# Patient Record
Sex: Female | Born: 1985 | Race: Black or African American | Hispanic: No | Marital: Single | Smoking: Current some day smoker
Health system: Southern US, Community
[De-identification: ages and names within clinical notes are randomized; demographics above are authoritative.]

## PROBLEM LIST (undated history)

## (undated) DIAGNOSIS — Z789 Other specified health status: Secondary | ICD-10-CM

## (undated) DIAGNOSIS — T7840XA Allergy, unspecified, initial encounter: Secondary | ICD-10-CM

## (undated) DIAGNOSIS — I10 Essential (primary) hypertension: Secondary | ICD-10-CM

## (undated) HISTORY — DX: Essential (primary) hypertension: I10

## (undated) HISTORY — DX: Other specified health status: Z78.9

## (undated) HISTORY — DX: Allergy, unspecified, initial encounter: T78.40XA

---

## 2006-05-15 ENCOUNTER — Ambulatory Visit (HOSPITAL_COMMUNITY): Admission: RE | Admit: 2006-05-15 | Discharge: 2006-05-15 | Payer: Self-pay | Admitting: Family Medicine

## 2006-05-30 ENCOUNTER — Ambulatory Visit (HOSPITAL_COMMUNITY): Admission: RE | Admit: 2006-05-30 | Discharge: 2006-05-30 | Payer: Self-pay | Admitting: Family Medicine

## 2006-10-24 ENCOUNTER — Inpatient Hospital Stay (HOSPITAL_COMMUNITY): Admission: AD | Admit: 2006-10-24 | Discharge: 2006-10-27 | Payer: Self-pay | Admitting: Obstetrics

## 2006-10-24 ENCOUNTER — Encounter (INDEPENDENT_AMBULATORY_CARE_PROVIDER_SITE_OTHER): Payer: Self-pay | Admitting: Specialist

## 2010-04-19 ENCOUNTER — Emergency Department (HOSPITAL_BASED_OUTPATIENT_CLINIC_OR_DEPARTMENT_OTHER): Admission: EM | Admit: 2010-04-19 | Discharge: 2010-04-19 | Payer: Self-pay | Admitting: Emergency Medicine

## 2011-01-09 LAB — URINE MICROSCOPIC-ADD ON

## 2011-01-09 LAB — URINALYSIS, ROUTINE W REFLEX MICROSCOPIC
Glucose, UA: NEGATIVE mg/dL
Leukocytes, UA: NEGATIVE

## 2011-01-09 LAB — PREGNANCY, URINE: Preg Test, Ur: NEGATIVE

## 2011-01-09 LAB — GC/CHLAMYDIA PROBE AMP, GENITAL
Chlamydia, DNA Probe: NEGATIVE
GC Probe Amp, Genital: NEGATIVE

## 2011-01-09 LAB — WET PREP, GENITAL

## 2011-03-11 NOTE — H&P (Signed)
NAME:  Elaine Martin, Elaine Martin                ACCOUNT NO.:  [id number]   MEDICAL RECORD NO.:  [id number]          PATIENT TYPE:  INP   LOCATION:  9124                          FACILITY:  [facility]   PHYSICIAN:  Kathreen Cosier, M.D.DATE OF BIRTH:  06/12/1986   DATE OF ADMISSION:  10/24/2006  DATE OF DISCHARGE:                              HISTORY & PHYSICAL   The patient is a 25 year old gravida 2, para 0-0-1-0, [facility] October 27, 2006.  She was admitted in labor, 4 cm, vertex, -3.  At 6:30 a.m. she  was low, anterior with a vertex at -2 to -3 station, intact.  Membranes  were ruptured artificially and fluid was meconium stained.  She had a  positive GBS and received penicillin.  It was noted the patient had  variable decelerations with each contraction, and at one stage had a 7  minute deceleration of less than 90.  She was started on oxygen and  position change instituted.  At 8:45 a.m. started pushing, and there was  no descent of the vertex with 2 contractions, but she had deep variables  with each contraction.  So, it was decided she would be delivered by C-  section for nonreassuring fetal heart tracing.   PHYSICAL EXAMINATION:  GENERAL:  Revealed a well-developed female in  labor.  HEENT:  Negative.  LUNGS:  Clear.  HEART:  Regular rhythm.  No murmurs, rubs or gallops.  BREASTS:  No masses.  ABDOMEN:  Term sized uterus.  Estimated fetal weight was 6 pounds 14  ounces.  EXTREMITIES:  Negative.           ______________________________  Kathreen Cosier, M.D.     BAM/MEDQ  D:  10/24/2006  T:  10/24/2006  Job:  [id number]

## 2011-03-11 NOTE — Op Note (Signed)
Elaine Martin, Elaine Martin                ACCOUNT NO.:  [id number]   MEDICAL RECORD NO.:  [id number]          PATIENT TYPE:  INP   LOCATION:  9124                          FACILITY[facility]  [facility]   PHYSICIAN:  Kathreen Cosier, M.D.DATE OF BIRTH:  10-Dec-1985   DATE OF PROCEDURE:  DATE OF DISCHARGE:                               OPERATIVE REPORT   DATE OF OPERATION:  10/24/2006   PREOPERATIVE DIAGNOSIS:  Nonreassuring fetal heart rate tracing.   PROCEDURE:  Primary low transverse cesarean section.   SURGEON:  Kathreen Cosier, M.D.   ANESTHESIA:  Epidural.   PROCEDURE:  Patient placed on the operating table in the supine  position.  Abdomen prepped and draped.  Bladder emptied with a Foley  catheter.  A transverse suprapubic incision made, carried down to the  rectus fascia.  Fascia __________  muscles retracted laterally.  Peritoneum incised longitudinally.  A transverse incision made on the  visceral peritoneum above the bladder, bladder mobilized.  Transverse  lower uterine incision made __________  delivered from the OP position,  a female, Apgars 4 and 9, weighing 6 pounds 2 ounces.  The team was in  attendance.  The placenta was posterior, removed manually and sent to  Pathology.  Cord pH was 7.19.  uterine cavity cleaned with dry laps.  Uterine incision closed in one layer with continuous suture of #1  chromic.  Hemostasis satisfactory.  Bladder flap reattached with 2-0  chromic.  Uterus well contracted.  Tubes and ovaries normal.  Abdomen  closed in layers.  Peritoneum with continuous suture of 0 chromic,  fascia with continuous suture of 0 Dexon.  Skin closed with subcuticular  stitch of 4-0 Monocryl.  Blood loss 500 mL.  The patient tolerated the  procedure well, taken to recovery room in good condition.           ______________________________  Kathreen Cosier, M.D.     BAM/MEDQ  D:  10/24/2006  T:  10/24/2006  Job:  [id number]

## 2011-03-11 NOTE — Discharge Summary (Signed)
NAME:  LAURENA, VALKO                ACCOUNT NO.:  [id number]   MEDICAL RECORD NO.:  [id number]          PATIENT TYPE:  INP   LOCATION:  9124                          [facility]ILITY[facility]  [facility]   PHYSICIAN:  Kathreen Cosier, M.D.DATE OF BIRTH:  1986/09/03   DATE OF ADMISSION:  10/24/2006  DATE OF DISCHARGE:  10/27/2006                               DISCHARGE SUMMARY   HOSPITAL COURSE:  The patient is a 25 year old gravida 2, para 0-0-1-0  with [facility] October 27, 2006 admitted in labor and shows anterior  lip with  a vertex at the minus 3 station.  Membranes were ruptured artificially.  She had meconium stained fluid.  The patient was noted to have  spontaneous variable decelerations which lasted up to 7 minutes.  When  she became fully dilated and started pushing there was no descent of the  vertex, however, she had deep variable decelerations and she was taken  for a Cesarean section for non-reassuring fetal heart rate tracing.  She  had a female with Apgar's of 4 and 9, weighing 6 pounds, 2 ounces from the  OP position.  Cord pH was 7.19.  The fetal heart rate was noted to be 60  when she was taken the operating room.  Postoperatively she did well.  Her hemoglobin was 9.3.  She was discharged on the third postoperative  day, ambulatory and on a regular diet on Tylox for pain, ferrous sulfate  for her anemia and is to see me in six weeks.   DISCHARGE DIAGNOSIS:  Status post primary low transverse Cesarean  section at term for non-reassuring fetal heart rate tracing.           ______________________________  Kathreen Cosier, M.D.     BAM/MEDQ  D:  11/08/2006  T:  11/08/2006  Job:  [id number]

## 2014-01-06 ENCOUNTER — Emergency Department (HOSPITAL_COMMUNITY): Payer: Managed Care, Other (non HMO)

## 2014-01-06 ENCOUNTER — Emergency Department (HOSPITAL_COMMUNITY)
Admission: EM | Admit: 2014-01-06 | Discharge: 2014-01-06 | Disposition: A | Discharge status: Home / Self Care | Payer: Managed Care, Other (non HMO) | Attending: Emergency Medicine | Admitting: Emergency Medicine

## 2014-01-06 DIAGNOSIS — M25469 Effusion, unspecified knee: Secondary | ICD-10-CM | POA: Insufficient documentation

## 2014-01-06 DIAGNOSIS — M25561 Pain in right knee: Secondary | ICD-10-CM

## 2014-01-06 DIAGNOSIS — M25569 Pain in unspecified knee: Secondary | ICD-10-CM | POA: Insufficient documentation

## 2014-01-06 DIAGNOSIS — Z87828 Personal history of other (healed) physical injury and trauma: Secondary | ICD-10-CM | POA: Insufficient documentation

## 2014-01-06 MED ORDER — IBUPROFEN 800 MG PO TABS
800.0000 mg | ORAL_TABLET | Freq: Once | ORAL | Status: AC
  Administered 2014-01-06: 800 mg via ORAL
  Filled 2014-01-06: qty 1

## 2014-01-06 MED ORDER — IBUPROFEN 800 MG PO TABS: 800.0000 mg | ORAL_TABLET | Freq: Three times a day (TID) | ORAL | Status: DC

## 2014-01-06 MED ORDER — HYDROCODONE-ACETAMINOPHEN 5-325 MG PO TABS: 1.0000 | ORAL_TABLET | ORAL | Status: DC | PRN

## 2014-01-06 NOTE — Discharge Instructions (Signed)
Call for a follow up appointment with Dr. Luiz BlareGraves or an Orthopedic Surgeon.  Return if Symptoms worsen, you develop a fever or your knee starts turning red.   Take medication as prescribed.  Ice your knee 3-4 times a day.

## 2014-01-06 NOTE — ED Notes (Signed)
She c/o right knee pain ever since stepping off curb yesterday.  She is in no distress.

## 2014-01-06 NOTE — ED Provider Notes (Signed)
Medical screening examination/treatment/procedure(s) were performed by non-physician practitioner and as supervising physician I was immediately available for consultation/collaboration.   EKG Interpretation None       Shon Batonourtney F Horton, MD 01/06/14 [phone]

## 2014-01-06 NOTE — ED Provider Notes (Signed)
CSN: [id number]     Arrival date & time 01/06/14  0745 History   First MD Initiated Contact with Patient 01/06/14 0750     Chief Complaint  Patient presents with  . Knee Pain     (Consider location/radiation/quality/duration/timing/severity/associated sxs/prior Treatment) HPI Comments: Elaine Martin is a 28 y.o. Female, presents to the Emergency Department with a chief complaint of Right knee pain and swelling since yesterday.  The patient reports stepping down off a curb yesterday and immediately having discomfort. She describes the discomfort as "not feeling right" but denies pain.  She reports associated decrease in ROM and swelling.  She denies falling or other injury.  The patient states she had a right knee dislocation 11 years ago.   Patient is a 28 y.o. female presenting with knee pain. The history is provided by the patient. No language interpreter was used.  Knee Pain Associated symptoms: no back pain, no fever and no neck pain     No past medical history on file. No past surgical history on file. No family history on file. History  Substance Use Topics  . Smoking status: Not on file  . Smokeless tobacco: Not on file  . Alcohol Use: Not on file   OB History   No data available     Review of Systems  Constitutional: Negative for fever and chills.  Musculoskeletal: Positive for arthralgias, gait problem and joint swelling. Negative for back pain, neck pain and neck stiffness.  Skin: Negative for color change, rash and wound.  Neurological: Negative for numbness.      Allergies  Review of patient's allergies indicates no known allergies.  Home Medications  No current outpatient prescriptions on file. LMP 12/26/2013 Physical Exam  Nursing note and vitals reviewed. Constitutional: She is oriented to person, place, and time. She appears well-developed and well-nourished. No distress.  HENT:  Head: Normocephalic and atraumatic.  Neck: Neck supple.   Pulmonary/Chest: Effort normal. No respiratory distress.  Musculoskeletal: She exhibits edema.       Right knee: She exhibits decreased range of motion and swelling. She exhibits no ecchymosis, no deformity, no laceration, no erythema, no LCL laxity, normal patellar mobility, no bony tenderness and no MCL laxity. No medial joint line, no lateral joint line and no patellar tendon tenderness noted.  Right Knee: Mild swelling, decrease in active flexion and extension. No crepitus. No erythema or increased temperature to touch.  Neurological: She is alert and oriented to person, place, and time. No sensory deficit. Gait abnormal.  Skin: Skin is warm.  Psychiatric: She has a normal mood and affect. Her behavior is normal.    ED Course  Procedures (including critical care time) Labs Review Labs Reviewed - No data to display Imaging Review Dg Knee Complete 4 Views Right  01/06/2014   CLINICAL DATA:  Right knee injury, now with pain, history of previous dislocation  EXAM: RIGHT KNEE - COMPLETE 4+ VIEW  COMPARISON:  None.  FINDINGS: The bones of the knee are adequately mineralized. There is no evidence of an acute fracture or dislocation. There is no lytic or blastic lesion or periosteal reaction. There may be a joint effusion.  IMPRESSION: There is no acute bony abnormality of the right knee but a joint effusion may be present. Follow-up MRI would be of value if the patient's symptoms persist despite therapy.   Electronically Signed   By: David  [place]   On: 01/06/2014 08:32     EKG Interpretation None  MDM   Final diagnoses:  Right knee pain   Pt with decrease ROM and mild swelling to right knee, history of knee dislocation.  XR without fracture, questionable effusion. Discussed imaging results, and treatment plan (Crutches, ACEwrap, follow up with Ortho, RICE encouraged) with the patient. Return precautions given. Reports understanding and no other concerns at this time.  Patient is  stable for discharge at this time.  Meds given in ED:  Medications  ibuprofen (ADVIL,MOTRIN) tablet 800 mg (800 mg Oral Given 01/06/14 0830)    New Prescriptions   No medications on file        Clabe Seal, PA-C 01/06/14 1610  Clabe Seal, PA-C 01/06/14 [phone]

## 2014-04-21 ENCOUNTER — Ambulatory Visit: Payer: Self-pay | Admitting: Obstetrics

## 2014-05-08 ENCOUNTER — Ambulatory Visit (INDEPENDENT_AMBULATORY_CARE_PROVIDER_SITE_OTHER): Payer: Managed Care, Other (non HMO) | Admitting: Obstetrics

## 2014-05-08 ENCOUNTER — Encounter: Payer: Self-pay | Admitting: Obstetrics

## 2014-05-08 VITALS — BP 160/94 | HR 81 | Temp 98.1°F | Ht 64.0 in | Wt 219.0 lb

## 2014-05-08 DIAGNOSIS — B9689 Other specified bacterial agents as the cause of diseases classified elsewhere: Secondary | ICD-10-CM

## 2014-05-08 DIAGNOSIS — N760 Acute vaginitis: Secondary | ICD-10-CM

## 2014-05-08 DIAGNOSIS — Z01419 Encounter for gynecological examination (general) (routine) without abnormal findings: Secondary | ICD-10-CM

## 2014-05-08 MED ORDER — TINIDAZOLE 500 MG PO TABS: 1000.0000 mg | ORAL_TABLET | Freq: Every day | ORAL | Status: DC

## 2014-05-09 LAB — PAP IG W/ RFLX HPV ASCU

## 2014-05-09 LAB — WET PREP BY MOLECULAR PROBE
CANDIDA SPECIES: NEGATIVE
GARDNERELLA VAGINALIS: POSITIVE — AB
Trichomonas vaginosis: NEGATIVE

## 2014-05-09 NOTE — Progress Notes (Signed)
Subjective:     Elaine Martin is a 28 y.o. female here for a routine exam.  Current complaints: None.    Personal health questionnaire:  Is patient Ashkenazi Jewish, have a family history of breast and/or ovarian cancer: no Is there a family history of uterine cancer diagnosed at age < 39, gastrointestinal cancer, urinary tract cancer, family member who is a Personnel officer syndrome-associated carrier: no Is the patient overweight and hypertensive, family history of diabetes, personal history of gestational diabetes or PCOS: no Is patient over 65, have PCOS,  family history of premature CHD under age 1, diabetes, smoke, have hypertension or peripheral artery disease:  no  The HPI was reviewed and explored in further detail by the provider. Gynecologic History Patient's last menstrual period was 04/17/2014. Contraception: none Last Pap: 2014. Results were: normal Last mammogram: n/a. Results were: n/a  Obstetric History OB History  Gravida Para Term Preterm AB SAB TAB Ectopic Multiple Living  2 1 1  1     1     # Outcome Date GA Lbr Len/2nd Weight Sex Delivery Anes PTL Lv  2 TRM 10/24/06 [redacted]w[redacted]d  6 lb 2 oz (2.778 kg) M LTCS Spinal N Y  1 ABT 2004              History reviewed. No pertinent past medical history.  History reviewed. No pertinent past surgical history.  Current outpatient prescriptions:tinidazole (TINDAMAX) 500 MG tablet, Take 2 tablets (1,000 mg total) by mouth daily with breakfast., Disp: 10 tablet, Rfl: 2 No Known Allergies  History  Substance Use Topics  . Smoking status: Current Every Day Smoker    Types: Cigarettes  . Smokeless tobacco: Never Used  . Alcohol Use: No    Family History  Problem Relation Age of Onset  . Hypertension Mother   . Diabetes Mother       Review of Systems  Constitutional: negative for fatigue and weight loss Respiratory: negative for cough and wheezing Cardiovascular: negative for chest pain, fatigue and palpitations Gastrointestinal:  negative for abdominal pain and change in bowel habits Musculoskeletal:negative for myalgias Neurological: negative for gait problems and tremors Behavioral/Psych: negative for abusive relationship, depression Endocrine: negative for temperature intolerance   Genitourinary:negative for abnormal menstrual periods, genital lesions, hot flashes, sexual problems and vaginal discharge Integument/breast: negative for breast lump, breast tenderness, nipple discharge and skin lesion(s)    Objective:       BP 160/94  Pulse 81  Temp(Src) 98.1 F (36.7 C)  Ht 5\' 4"  (1.626 m)  Wt 219 lb (99.338 kg)  BMI 37.57 kg/m2  LMP 04/17/2014 General:   alert  Skin:   no rash or abnormalities  Lungs:   clear to auscultation bilaterally  Heart:   regular rate and rhythm, S1, S2 normal, no murmur, click, rub or gallop  Breasts:   normal without suspicious masses, skin or nipple changes or axillary nodes  Abdomen:  normal findings: no organomegaly, soft, non-tender and no hernia  Pelvis:  External genitalia: normal general appearance Urinary system: urethral meatus normal and bladder without fullness, nontender Vaginal: normal without tenderness, induration or masses Cervix: normal appearance Adnexa: normal bimanual exam Uterus: anteverted and non-tender, normal size   Lab Review Urine pregnancy test Labs reviewed yes Radiologic studies reviewed no   Assessment:    Healthy female exam.    Plan:    Education reviewed: low fat, low cholesterol diet, safe sex/STD prevention, self breast exams, weight bearing exercise and management and prevention of  BV. Contraception: none. Follow up in: 1 year. Tinidazole Rx for BV.   Meds ordered this encounter  Medications  . tinidazole (TINDAMAX) 500 MG tablet    Sig: Take 2 tablets (1,000 mg total) by mouth daily with breakfast.    Dispense:  10 tablet    Refill:  2   Orders Placed This Encounter  Procedures  . WET PREP BY MOLECULAR PROBE  .  GC/Chlamydia Probe Amp

## 2014-05-10 LAB — GC/CHLAMYDIA PROBE AMP
CT Probe RNA: NEGATIVE
GC Probe RNA: NEGATIVE

## 2014-08-25 ENCOUNTER — Encounter: Payer: Self-pay | Admitting: Obstetrics

## 2014-09-15 ENCOUNTER — Encounter: Payer: Self-pay | Admitting: Obstetrics

## 2014-09-15 ENCOUNTER — Ambulatory Visit (INDEPENDENT_AMBULATORY_CARE_PROVIDER_SITE_OTHER): Payer: Medicaid Other | Admitting: Obstetrics

## 2014-09-15 ENCOUNTER — Other Ambulatory Visit: Payer: Self-pay | Admitting: Obstetrics

## 2014-09-15 VITALS — BP 125/70 | HR 85 | Temp 99.6°F | Wt 229.0 lb

## 2014-09-15 DIAGNOSIS — Z3481 Encounter for supervision of other normal pregnancy, first trimester: Secondary | ICD-10-CM

## 2014-09-15 LAB — POCT URINALYSIS DIPSTICK
BILIRUBIN UA: NEGATIVE
Blood, UA: 50
GLUCOSE UA: NEGATIVE
KETONES UA: NEGATIVE
LEUKOCYTES UA: NEGATIVE
NITRITE UA: NEGATIVE
Protein, UA: NEGATIVE
SPEC GRAV UA: 1.01
UROBILINOGEN UA: NEGATIVE
pH, UA: 6

## 2014-09-15 LAB — TSH: TSH: 1.826 u[IU]/mL (ref 0.350–4.500)

## 2014-09-15 LAB — OB RESULTS CONSOLE GC/CHLAMYDIA
Chlamydia: NEGATIVE
Gonorrhea: NEGATIVE

## 2014-09-15 MED ORDER — OB COMPLETE PETITE 35-5-1-200 MG PO CAPS: 1.0000 | ORAL_CAPSULE | Freq: Every day | ORAL | Status: DC

## 2014-09-15 NOTE — Progress Notes (Signed)
Subjective:    Elaine Martin is being seen today for her first obstetrical visit.  This is not a planned pregnancy. She is at 3334w5d gestation. Her obstetrical history is significant for none. Relationship with FOB: significant other, not living together. Patient does intend to breast feed. Pregnancy history fully reviewed.  The information documented in the HPI was reviewed and verified.  Menstrual History: OB History    Gravida Para Term Preterm AB TAB SAB Ectopic Multiple Living   3 1 1  1     1                                                                                                                                                                                                                                                       Patient's last menstrual period was 07/02/2014.    Past Medical History  Diagnosis Date  . Medical history non-contributory     Past Surgical History  Procedure Laterality Date  . Cesarean section       (Not in a hospital admission) No Known Allergies  History  Substance Use Topics  . Smoking status: Former Smoker    Types: Cigarettes  . Smokeless tobacco: Never Used  . Alcohol Use: No    Family History  Problem Relation Age of Onset  . Hypertension Mother   . Diabetes Mother      Review of Systems Constitutional: negative for weight loss Gastrointestinal: negative for vomiting Genitourinary:negative for genital lesions and vaginal discharge and dysuria Musculoskeletal:negative for back pain Behavioral/Psych: negative for abusive relationship, depression, illegal drug usage and tobacco use    Objective:    BP 125/70 mmHg  Pulse 85  Temp(Src) 99.6 F (37.6 C)  Wt 229 lb (103.874 kg)  LMP 07/02/2014 General Appearance:    Alert, cooperative, no distress, appears stated age  Head:    Normocephalic, without obvious abnormality, atraumatic  Eyes:    PERRL, conjunctiva/corneas clear, EOM's intact, fundi    benign, both eyes  Ears:     Normal TM's and external ear canals, both ears  Nose:   Nares normal, septum midline, mucosa normal, no drainage    or sinus tenderness  Throat:   Lips, mucosa, and tongue normal; teeth and gums normal  Neck:   Supple, symmetrical, trachea midline, no adenopathy;  thyroid:  no enlargement/tenderness/nodules; no carotid   bruit or JVD  Back:     Symmetric, no curvature, ROM normal, no CVA tenderness  Lungs:     Clear to auscultation bilaterally, respirations unlabored  Chest Wall:    No tenderness or deformity   Heart:    Regular rate and rhythm, S1 and S2 normal, no murmur, rub   or gallop  Breast Exam:    No tenderness, masses, or nipple abnormality  Abdomen:     Soft, non-tender, bowel sounds active all four quadrants,    no masses, no organomegaly  Genitalia:    Normal female without lesion, discharge or tenderness  Extremities:   Extremities normal, atraumatic, no cyanosis or edema  Pulses:   2+ and symmetric all extremities  Skin:   Skin color, texture, turgor normal, no rashes or lesions  Lymph nodes:   Cervical, supraclavicular, and axillary nodes normal  Neurologic:   CNII-XII intact, normal strength, sensation and reflexes    throughout      Lab Review Urine pregnancy test Labs reviewed yes Radiologic studies reviewed no Assessment:    Pregnancy at 6145w5d weeks    Plan:      Prenatal vitamins.  Counseling provided regarding continued use of seat belts, cessation of alcohol consumption, smoking or use of illicit drugs; infection precautions i.e., influenza/TDAP immunizations, toxoplasmosis,CMV, parvovirus, listeria and varicella; workplace safety, exercise during pregnancy; routine dental care, safe medications, sexual activity, hot tubs, saunas, pools, travel, caffeine use, fish and methlymercury, potential toxins, hair treatments, varicose veins Weight gain recommendations per IOM guidelines reviewed: underweight/BMI< 18.5--> gain 28 - 40 lbs; normal weight/BMI 18.5  - 24.9--> gain 25 - 35 lbs; overweight/BMI 25 - 29.9--> gain 15 - 25 lbs; obese/BMI >30->gain  11 - 20 lbs Problem list reviewed and updated. FIRST/CF mutation testing/NIPT/QUAD SCREEN/fragile X/Ashkenazi Jewish population testing/Spinal muscular atrophy discussed: requested. Role of ultrasound in pregnancy discussed; fetal survey: requested. Amniocentesis discussed: requested. VBAC calculator score: VBAC consent form provided Meds ordered this encounter  Medications  . Prenat-FeCbn-FeAspGl-FA-Omega (OB COMPLETE PETITE) 35-5-1-200 MG CAPS    Sig: Take 1 capsule by mouth daily before breakfast.    Dispense:  90 capsule    Refill:  3   Orders Placed This Encounter  Procedures  . WET PREP BY MOLECULAR PROBE  . GC/Chlamydia Probe Amp  . Culture, OB Urine  . Obstetric panel  . HIV antibody  . Hemoglobinopathy evaluation  . Varicella zoster antibody, IgG  . Vit D  25 hydroxy (rtn osteoporosis monitoring)  . TSH  . POCT urinalysis dipstick    Follow up in 5 weeks.

## 2014-09-16 ENCOUNTER — Other Ambulatory Visit: Payer: Self-pay | Admitting: Obstetrics

## 2014-09-16 DIAGNOSIS — B9689 Other specified bacterial agents as the cause of diseases classified elsewhere: Secondary | ICD-10-CM

## 2014-09-16 DIAGNOSIS — N760 Acute vaginitis: Principal | ICD-10-CM

## 2014-09-16 LAB — OBSTETRIC PANEL
ANTIBODY SCREEN: NEGATIVE
BASOS PCT: 0 % (ref 0–1)
Basophils Absolute: 0 10*3/uL (ref 0.0–0.1)
EOS PCT: 1 % (ref 0–5)
Eosinophils Absolute: 0.1 10*3/uL (ref 0.0–0.7)
HCT: 34.9 % — ABNORMAL LOW (ref 36.0–46.0)
HEMOGLOBIN: 11.8 g/dL — AB (ref 12.0–15.0)
HEP B S AG: NEGATIVE
LYMPHS ABS: 2.2 10*3/uL (ref 0.7–4.0)
Lymphocytes Relative: 27 % (ref 12–46)
MCH: 29.8 pg (ref 26.0–34.0)
MCHC: 33.8 g/dL (ref 30.0–36.0)
MCV: 88.1 fL (ref 78.0–100.0)
MONOS PCT: 8 % (ref 3–12)
MPV: 10.1 fL (ref 9.4–12.4)
Monocytes Absolute: 0.7 10*3/uL (ref 0.1–1.0)
NEUTROS ABS: 5.2 10*3/uL (ref 1.7–7.7)
Neutrophils Relative %: 64 % (ref 43–77)
PLATELETS: 260 10*3/uL (ref 150–400)
RBC: 3.96 MIL/uL (ref 3.87–5.11)
RDW: 13.8 % (ref 11.5–15.5)
Rh Type: POSITIVE
Rubella: 7.29 Index — ABNORMAL HIGH (ref <0.90)
WBC: 8.2 10*3/uL (ref 4.0–10.5)

## 2014-09-16 LAB — CULTURE, OB URINE
COLONY COUNT: NO GROWTH
Organism ID, Bacteria: NO GROWTH

## 2014-09-16 LAB — WET PREP BY MOLECULAR PROBE
CANDIDA SPECIES: NEGATIVE
GARDNERELLA VAGINALIS: POSITIVE — AB
Trichomonas vaginosis: NEGATIVE

## 2014-09-16 LAB — GC/CHLAMYDIA PROBE AMP
CT PROBE, AMP APTIMA: NEGATIVE
GC Probe RNA: NEGATIVE

## 2014-09-16 LAB — VITAMIN D 25 HYDROXY (VIT D DEFICIENCY, FRACTURES): Vit D, 25-Hydroxy: 8 ng/mL — ABNORMAL LOW (ref 30–100)

## 2014-09-16 LAB — VARICELLA ZOSTER ANTIBODY, IGG: Varicella IgG: 3683 Index — ABNORMAL HIGH (ref <135.00)

## 2014-09-16 LAB — HIV ANTIBODY (ROUTINE TESTING W REFLEX): HIV 1&2 Ab, 4th Generation: NONREACTIVE

## 2014-09-16 MED ORDER — CLINDAMYCIN HCL 300 MG PO CAPS: 300.0000 mg | ORAL_CAPSULE | Freq: Three times a day (TID) | ORAL | Status: DC

## 2014-09-17 ENCOUNTER — Telehealth: Payer: Self-pay

## 2014-09-17 DIAGNOSIS — Z029 Encounter for administrative examinations, unspecified: Secondary | ICD-10-CM

## 2014-09-17 LAB — HEMOGLOBINOPATHY EVALUATION
Hemoglobin Other: 0 %
Hgb A2 Quant: 2.5 % (ref 2.2–3.2)
Hgb A: 97.5 % (ref 96.8–97.8)
Hgb F Quant: 0 % (ref 0.0–2.0)
Hgb S Quant: 0 %

## 2014-09-17 NOTE — Telephone Encounter (Signed)
FMLA papers ready to pick-up, etc 15.00 charge - left message

## 2014-09-23 ENCOUNTER — Inpatient Hospital Stay (HOSPITAL_COMMUNITY)
Admission: AD | Admit: 2014-09-23 | Payer: Managed Care, Other (non HMO) | Source: Ambulatory Visit | Admitting: Obstetrics

## 2014-10-20 ENCOUNTER — Encounter: Payer: Self-pay | Admitting: Obstetrics

## 2014-10-20 ENCOUNTER — Ambulatory Visit (INDEPENDENT_AMBULATORY_CARE_PROVIDER_SITE_OTHER): Payer: Medicaid Other | Admitting: Obstetrics

## 2014-10-20 ENCOUNTER — Encounter: Payer: Medicaid Other | Admitting: Obstetrics

## 2014-10-20 VITALS — BP 125/77 | HR 86 | Temp 99.4°F | Wt 229.0 lb

## 2014-10-20 DIAGNOSIS — Z3482 Encounter for supervision of other normal pregnancy, second trimester: Secondary | ICD-10-CM

## 2014-10-20 NOTE — Progress Notes (Signed)
  Subjective:    Elaine Martin is a 28 y.o. female being seen today for her obstetrical visit. She is at 4897w5d gestation. Patient reports: no complaints.  Problem List Items Addressed This Visit    None    Visit Diagnoses    Encounter for supervision of other normal pregnancy in second trimester    -  Primary    Relevant Orders       POCT urinalysis dipstick       AFP, Quad Screen       US OB Comp + 14 Wk      Patient Active Problem List   Diagnosis Date Noted  . BV (bacterial vaginosis) 05/08/2014    Objective:     BP 125/77 mmHg  Pulse 86  Temp(Src) 99.4 F (37.4 C)  Wt 229 lb (103.874 kg)  LMP 07/02/2014 Uterine Size: Below umbilicus     Assessment:    Pregnancy @ 7197w5d  weeks Doing well    Plan:    Problem list reviewed and updated. Labs reviewed.  Follow up in 4 weeks. FIRST/CF mutation testing/NIPT/QUAD SCREEN/fragile X/Ashkenazi Jewish population testing/Spinal muscular atrophy discussed: requested. Role of ultrasound in pregnancy discussed; fetal survey: requested. Amniocentesis discussed: not indicated.

## 2014-10-22 LAB — AFP, QUAD SCREEN
AFP: 45.3 ng/mL
Age Alone: 1:755 {titer}
CURR GEST AGE: 15.5 wks.days
Down Syndrome Scr Risk Est: 1:5560 {titer}
HCG, Total: 23.44 IU/mL
INH: 357.2 pg/mL
Interpretation-AFP: NEGATIVE
MOM FOR AFP: 1.58
MoM for INH: 2.4
MoM for hCG: 0.65
Open Spina bifida: NEGATIVE
Osb Risk: 1:4390 {titer}
Tri 18 Scr Risk Est: NEGATIVE
UE3 VALUE: 0.52 ng/mL
uE3 Mom: 0.74

## 2014-11-18 ENCOUNTER — Encounter: Payer: Medicaid Other | Admitting: Obstetrics

## 2014-11-24 ENCOUNTER — Other Ambulatory Visit: Payer: Self-pay | Admitting: Obstetrics

## 2014-11-24 ENCOUNTER — Ambulatory Visit (INDEPENDENT_AMBULATORY_CARE_PROVIDER_SITE_OTHER): Payer: Medicaid Other | Admitting: Obstetrics

## 2014-11-24 ENCOUNTER — Encounter: Payer: Self-pay | Admitting: Obstetrics

## 2014-11-24 ENCOUNTER — Ambulatory Visit (HOSPITAL_COMMUNITY)
Admission: RE | Admit: 2014-11-24 | Discharge: 2014-11-24 | Disposition: A | Discharge status: Home / Self Care | Payer: Managed Care, Other (non HMO) | Source: Ambulatory Visit | Attending: Obstetrics | Admitting: Obstetrics

## 2014-11-24 VITALS — BP 119/72 | HR 83 | Temp 98.5°F | Wt 235.0 lb

## 2014-11-24 DIAGNOSIS — Z36 Encounter for antenatal screening of mother: Secondary | ICD-10-CM | POA: Insufficient documentation

## 2014-11-24 DIAGNOSIS — Z3482 Encounter for supervision of other normal pregnancy, second trimester: Secondary | ICD-10-CM

## 2014-11-24 DIAGNOSIS — Z3689 Encounter for other specified antenatal screening: Secondary | ICD-10-CM | POA: Insufficient documentation

## 2014-11-24 DIAGNOSIS — O99212 Obesity complicating pregnancy, second trimester: Secondary | ICD-10-CM | POA: Insufficient documentation

## 2014-11-24 DIAGNOSIS — Z3A20 20 weeks gestation of pregnancy: Secondary | ICD-10-CM | POA: Diagnosis not present

## 2014-11-24 DIAGNOSIS — O99210 Obesity complicating pregnancy, unspecified trimester: Secondary | ICD-10-CM | POA: Insufficient documentation

## 2014-11-24 LAB — POCT URINALYSIS DIPSTICK
Bilirubin, UA: NEGATIVE
Glucose, UA: NEGATIVE
Ketones, UA: NEGATIVE
LEUKOCYTES UA: NEGATIVE
Nitrite, UA: NEGATIVE
PH UA: 6
PROTEIN UA: NEGATIVE
Spec Grav, UA: 1.01
Urobilinogen, UA: NEGATIVE

## 2014-11-24 NOTE — Addendum Note (Signed)
Addended by: Marya LandryFOSTER, Yee Gangi D on: 11/24/2014 10:53 AM   Modules accepted: Orders

## 2014-11-24 NOTE — Progress Notes (Signed)
Subjective:    Elaine Martin is a 29 y.o. female being seen today for her obstetrical visit. She is at 3660w5d gestation. Patient reports: no complaints . Fetal movement: normal.  Problem List Items Addressed This Visit    None     Patient Active Problem List   Diagnosis Date Noted  . Encounter for fetal anatomic survey   . [redacted] weeks gestation of pregnancy   . Obesity affecting pregnancy   . BV (bacterial vaginosis) 05/08/2014   Objective:    BP 119/72 mmHg  Pulse 83  Temp(Src) 98.5 F (36.9 C)  Wt 235 lb (106.595 kg)  LMP 07/02/2014 FHT: 150 BPM  Uterine Size: size equals dates     Assessment:    Pregnancy @ 7460w5d    Plan:    OBGCT: ordered for next visit.  Labs, problem list reviewed and updated 2 hr GTT planned Follow up in 4 weeks.

## 2014-12-23 ENCOUNTER — Ambulatory Visit (INDEPENDENT_AMBULATORY_CARE_PROVIDER_SITE_OTHER): Payer: Medicaid Other | Admitting: Obstetrics

## 2014-12-23 VITALS — BP 133/73 | HR 89 | Temp 98.7°F | Wt 239.0 lb

## 2014-12-23 DIAGNOSIS — Z3492 Encounter for supervision of normal pregnancy, unspecified, second trimester: Secondary | ICD-10-CM

## 2014-12-23 LAB — POCT URINALYSIS DIPSTICK
Glucose, UA: NEGATIVE
KETONES UA: NEGATIVE
Leukocytes, UA: NEGATIVE
NITRITE UA: NEGATIVE
PH UA: 6
Protein, UA: NEGATIVE
Spec Grav, UA: 1.01

## 2014-12-23 NOTE — Progress Notes (Signed)
Patient states she had a fainting episode earlier this month. Patient thinks she was hypoglycemic.

## 2014-12-24 NOTE — Progress Notes (Signed)
Subjective:    Elaine Martin is a 29 y.o. female being seen today for her obstetrical visit. She is at 65103w0d gestation. Patient reports: no complaints . Fetal movement: normal.  Problem List Items Addressed This Visit    None    Visit Diagnoses    Supervision of normal pregnancy, second trimester    -  Primary    Relevant Orders    POCT urinalysis dipstick (Completed)      Patient Active Problem List   Diagnosis Date Noted  . Encounter for fetal anatomic survey   . [redacted] weeks gestation of pregnancy   . Obesity affecting pregnancy   . BV (bacterial vaginosis) 05/08/2014   Objective:    BP 133/73 mmHg  Pulse 89  Temp(Src) 98.7 F (37.1 C)  Wt 239 lb (108.41 kg)  LMP 07/02/2014 FHT: 150 BPM  Uterine Size: size equals dates     Assessment:    Pregnancy @ 95103w0d    Plan:    OBGCT: ordered.  Labs, problem list reviewed and updated 2 hr GTT planned Follow up in 2 weeks.

## 2015-01-06 ENCOUNTER — Other Ambulatory Visit: Payer: Medicaid Other

## 2015-01-06 ENCOUNTER — Ambulatory Visit (INDEPENDENT_AMBULATORY_CARE_PROVIDER_SITE_OTHER): Payer: Medicaid Other | Admitting: Obstetrics

## 2015-01-06 ENCOUNTER — Encounter: Payer: Self-pay | Admitting: Obstetrics

## 2015-01-06 VITALS — BP 129/73 | HR 90 | Wt 237.0 lb

## 2015-01-06 DIAGNOSIS — Z3483 Encounter for supervision of other normal pregnancy, third trimester: Secondary | ICD-10-CM

## 2015-01-06 LAB — POCT URINALYSIS DIPSTICK
BILIRUBIN UA: NEGATIVE
GLUCOSE UA: NEGATIVE
KETONES UA: NEGATIVE
LEUKOCYTES UA: NEGATIVE
NITRITE UA: NEGATIVE
Protein, UA: NEGATIVE
Spec Grav, UA: 1.015
Urobilinogen, UA: NEGATIVE
pH, UA: 6

## 2015-01-06 LAB — CBC
HCT: 32.5 % — ABNORMAL LOW (ref 36.0–46.0)
Hemoglobin: 10.8 g/dL — ABNORMAL LOW (ref 12.0–15.0)
MCH: 30.7 pg (ref 26.0–34.0)
MCHC: 33.2 g/dL (ref 30.0–36.0)
MCV: 92.3 fL (ref 78.0–100.0)
MPV: 9.8 fL (ref 8.6–12.4)
PLATELETS: 212 10*3/uL (ref 150–400)
RBC: 3.52 MIL/uL — ABNORMAL LOW (ref 3.87–5.11)
RDW: 13.3 % (ref 11.5–15.5)
WBC: 8.9 10*3/uL (ref 4.0–10.5)

## 2015-01-06 NOTE — Progress Notes (Signed)
Subjective:    Elaine Martin is a 29 y.o. female being seen today for her obstetrical visit. She is at 2245w6d gestation. Patient reports: no complaints . Fetal movement: normal.  Problem List Items Addressed This Visit    None    Visit Diagnoses    Encounter for supervision of other normal pregnancy in second trimester    -  Primary    Relevant Orders    Glucose Tolerance, 2 Hours w/1 Hour    CBC    HIV antibody    RPR      Patient Active Problem List   Diagnosis Date Noted  . Encounter for fetal anatomic survey   . [redacted] weeks gestation of pregnancy   . Obesity affecting pregnancy   . BV (bacterial vaginosis) 05/08/2014   Objective:    BP 129/73 mmHg  Pulse 90  Wt 237 lb (107.502 kg)  LMP 07/02/2014 FHT: 150 BPM  Uterine Size: size equals dates     Assessment:    Pregnancy @ 7445w6d    Plan:    OBGCT: ordered.  Labs, problem list reviewed and updated 2 hr GTT planned Follow up in 2 weeks.

## 2015-01-07 LAB — RPR

## 2015-01-07 LAB — GLUCOSE TOLERANCE, 2 HOURS W/ 1HR
GLUCOSE, 2 HOUR: 48 mg/dL — AB (ref 70–139)
GLUCOSE: 88 mg/dL (ref 70–170)
Glucose, Fasting: 60 mg/dL — ABNORMAL LOW (ref 70–99)

## 2015-01-07 LAB — HIV ANTIBODY (ROUTINE TESTING W REFLEX): HIV: NONREACTIVE

## 2015-01-21 ENCOUNTER — Encounter: Payer: Self-pay | Admitting: Obstetrics

## 2015-01-21 ENCOUNTER — Ambulatory Visit (INDEPENDENT_AMBULATORY_CARE_PROVIDER_SITE_OTHER): Payer: Managed Care, Other (non HMO) | Admitting: Obstetrics

## 2015-01-21 VITALS — BP 127/80 | HR 89 | Temp 98.1°F | Wt 237.0 lb

## 2015-01-21 DIAGNOSIS — J302 Other seasonal allergic rhinitis: Secondary | ICD-10-CM

## 2015-01-21 DIAGNOSIS — Z331 Pregnant state, incidental: Secondary | ICD-10-CM

## 2015-01-21 DIAGNOSIS — Z3482 Encounter for supervision of other normal pregnancy, second trimester: Secondary | ICD-10-CM

## 2015-01-21 LAB — POCT URINALYSIS DIPSTICK
Bilirubin, UA: NEGATIVE
Blood, UA: 50
GLUCOSE UA: NEGATIVE
Ketones, UA: NEGATIVE
Leukocytes, UA: NEGATIVE
NITRITE UA: NEGATIVE
Protein, UA: NEGATIVE
SPEC GRAV UA: 1.015
Urobilinogen, UA: NEGATIVE
pH, UA: 6

## 2015-01-21 MED ORDER — LORATADINE 10 MG PO TABS: 10.0000 mg | ORAL_TABLET | Freq: Every day | ORAL | Status: DC

## 2015-01-21 NOTE — Progress Notes (Signed)
Subjective:    Elaine Martin is a 29 y.o. female being seen today for her obstetrical visit. She is at 5133w0d gestation. Patient reports no complaints. Fetal movement: normal.  Problem List Items Addressed This Visit    None    Visit Diagnoses    Encounter for supervision of other normal pregnancy in second trimester    -  Primary    Relevant Orders    POCT urinalysis dipstick (Completed)    Seasonal allergies        Relevant Medications    loratadine (CLARITIN) tablet 10 mg      Patient Active Problem List   Diagnosis Date Noted  . Encounter for fetal anatomic survey   . [redacted] weeks gestation of pregnancy   . Obesity affecting pregnancy   . BV (bacterial vaginosis) 05/08/2014   Objective:    BP 127/80 mmHg  Pulse 89  Temp(Src) 98.1 F (36.7 C)  Wt 237 lb (107.502 kg)  LMP 07/02/2014 FHT:  150 BPM  Uterine Size: size equals dates  Presentation: unsure     Assessment:    Pregnancy @ 8233w0d weeks   Plan:     labs reviewed, problem list updated Consent signed. GBS sent TDAP offered  Rhogam given for RH negative Pediatrician: discussed. Infant feeding: plans to breastfeed. Maternity leave: discussed. Cigarette smoking: former smoker. Orders Placed This Encounter  Procedures  . POCT urinalysis dipstick   Meds ordered this encounter  Medications  . loratadine (CLARITIN) 10 MG tablet    Sig: Take 1 tablet (10 mg total) by mouth daily.    Dispense:  30 tablet    Refill:  11   Follow up in 2 Weeks.

## 2015-02-05 ENCOUNTER — Ambulatory Visit (INDEPENDENT_AMBULATORY_CARE_PROVIDER_SITE_OTHER): Payer: Managed Care, Other (non HMO) | Admitting: Obstetrics

## 2015-02-05 VITALS — BP 122/80 | HR 83 | Temp 97.9°F | Wt 237.0 lb

## 2015-02-05 DIAGNOSIS — Z3483 Encounter for supervision of other normal pregnancy, third trimester: Secondary | ICD-10-CM

## 2015-02-05 LAB — POCT URINALYSIS DIPSTICK
BILIRUBIN UA: NEGATIVE
Glucose, UA: NEGATIVE
Ketones, UA: NEGATIVE
Leukocytes, UA: NEGATIVE
Nitrite, UA: NEGATIVE
PH UA: 6
Protein, UA: NEGATIVE
RBC UA: 50
Spec Grav, UA: 1.01
Urobilinogen, UA: NEGATIVE

## 2015-02-06 ENCOUNTER — Encounter: Payer: Self-pay | Admitting: Obstetrics

## 2015-02-06 NOTE — Progress Notes (Signed)
Subjective:    Elaine Martin is a 29 y.o. female being seen today for her obstetrical visit. She is at 9039w2d gestation. Patient reports no complaints. Fetal movement: normal.  Problem List Items Addressed This Visit    None    Visit Diagnoses    Encounter for supervision of other normal pregnancy in third trimester    -  Primary    Relevant Orders    POCT urinalysis dipstick (Completed)      Patient Active Problem List   Diagnosis Date Noted  . Encounter for fetal anatomic survey   . [redacted] weeks gestation of pregnancy   . Obesity affecting pregnancy   . BV (bacterial vaginosis) 05/08/2014   Objective:    BP 122/80 mmHg  Pulse 83  Temp(Src) 97.9 F (36.6 C)  Wt 237 lb (107.502 kg)  LMP 07/02/2014 FHT:  150 BPM  Uterine Size: size equals dates  Presentation: unsure     Assessment:    Pregnancy @ 3239w2d weeks   Plan:     labs reviewed, problem list updated Consent signed. GBS sent TDAP offered  Rhogam given for RH negative Pediatrician: discussed. Infant feeding: plans to breastfeed. Maternity leave: discussed. Cigarette smoking: former smoker. Orders Placed This Encounter  Procedures  . POCT urinalysis dipstick   No orders of the defined types were placed in this encounter.   Follow up in 2 Weeks.

## 2015-02-19 ENCOUNTER — Encounter: Payer: Managed Care, Other (non HMO) | Admitting: Obstetrics

## 2015-02-25 ENCOUNTER — Encounter: Payer: Self-pay | Admitting: Obstetrics

## 2015-02-25 ENCOUNTER — Ambulatory Visit (INDEPENDENT_AMBULATORY_CARE_PROVIDER_SITE_OTHER): Payer: Managed Care, Other (non HMO) | Admitting: Obstetrics

## 2015-02-25 VITALS — BP 127/80 | HR 88 | Temp 98.2°F | Wt 241.0 lb

## 2015-02-25 DIAGNOSIS — Z3483 Encounter for supervision of other normal pregnancy, third trimester: Secondary | ICD-10-CM

## 2015-02-25 NOTE — Progress Notes (Signed)
Subjective:    Elaine Martin is a 29 y.o. female being seen today for her obstetrical visit. She is at 199w0d gestation. Patient reports no complaints. Fetal movement: normal.  Problem List Items Addressed This Visit    None    Visit Diagnoses    Encounter for supervision of other normal pregnancy in third trimester    -  Primary    Relevant Orders    POCT urinalysis dipstick      Patient Active Problem List   Diagnosis Date Noted  . Encounter for fetal anatomic survey   . [redacted] weeks gestation of pregnancy   . Obesity affecting pregnancy   . BV (bacterial vaginosis) 05/08/2014   Objective:    BP 127/80 mmHg  Pulse 88  Temp(Src) 98.2 F (36.8 C)  Wt 241 lb (109.317 kg)  LMP 07/02/2014 FHT:  150 BPM  Uterine Size: size equals dates  Presentation: unsure     Assessment:    Pregnancy @ 719w0d weeks   Plan:     labs reviewed, problem list updated Consent signed. GBS sent TDAP offered  Rhogam given for RH negative Pediatrician: discussed. Infant feeding: plans to breastfeed. Maternity leave: discussed. Cigarette smoking: former smoker. Orders Placed This Encounter  Procedures  . POCT urinalysis dipstick   No orders of the defined types were placed in this encounter.   Follow up in 2 Weeks.

## 2015-03-05 ENCOUNTER — Encounter: Payer: Managed Care, Other (non HMO) | Admitting: Obstetrics

## 2015-03-12 ENCOUNTER — Encounter: Payer: Self-pay | Admitting: Obstetrics

## 2015-03-12 ENCOUNTER — Ambulatory Visit (INDEPENDENT_AMBULATORY_CARE_PROVIDER_SITE_OTHER): Payer: Managed Care, Other (non HMO) | Admitting: Obstetrics

## 2015-03-12 VITALS — BP 135/80 | HR 76 | Temp 98.4°F | Wt 239.0 lb

## 2015-03-12 DIAGNOSIS — Z3483 Encounter for supervision of other normal pregnancy, third trimester: Secondary | ICD-10-CM

## 2015-03-12 DIAGNOSIS — Z029 Encounter for administrative examinations, unspecified: Secondary | ICD-10-CM

## 2015-03-12 LAB — POCT URINALYSIS DIPSTICK
Bilirubin, UA: NEGATIVE
Glucose, UA: NEGATIVE
KETONES UA: NEGATIVE
Leukocytes, UA: NEGATIVE
Nitrite, UA: NEGATIVE
PROTEIN UA: NEGATIVE
RBC UA: NEGATIVE
SPEC GRAV UA: 1.01
UROBILINOGEN UA: NEGATIVE
pH, UA: 6.5

## 2015-03-12 NOTE — Progress Notes (Signed)
Subjective:    Elaine Martin is a 29 y.o. female being seen today for her obstetrical visit. She is at 188w1d gestation. Patient reports no complaints. Fetal movement: normal.  Problem List Items Addressed This Visit    None    Visit Diagnoses    Encounter for supervision of other normal pregnancy in third trimester    -  Primary    Relevant Orders    POCT urinalysis dipstick (Completed)      Patient Active Problem List   Diagnosis Date Noted  . Encounter for fetal anatomic survey   . [redacted] weeks gestation of pregnancy   . Obesity affecting pregnancy   . BV (bacterial vaginosis) 05/08/2014   Objective:    BP 135/80 mmHg  Pulse 76  Temp(Src) 98.4 F (36.9 C)  Wt 239 lb (108.41 kg)  LMP 07/02/2014 FHT:  150 BPM  Uterine Size: size equals dates  Presentation: unsure     Assessment:    Pregnancy @ 388w1d weeks   Plan:     labs reviewed, problem list updated Consent signed. GBS sent TDAP offered  Rhogam given for RH negative Pediatrician: discussed. Infant feeding: plans to breastfeed. Maternity leave: discussed. Cigarette smoking: former smoker. Orders Placed This Encounter  Procedures  . POCT urinalysis dipstick   No orders of the defined types were placed in this encounter.   Follow up in 1 Week.

## 2015-03-12 NOTE — Addendum Note (Signed)
Addended by: Marya LandryFOSTER, Daulton Harbaugh D on: 03/12/2015 03:20 PM   Modules accepted: Orders

## 2015-03-13 LAB — STREP B DNA PROBE: GBSP: DETECTED

## 2015-03-19 ENCOUNTER — Ambulatory Visit (INDEPENDENT_AMBULATORY_CARE_PROVIDER_SITE_OTHER): Payer: Managed Care, Other (non HMO) | Admitting: Obstetrics

## 2015-03-19 VITALS — BP 126/88 | HR 87 | Temp 98.4°F | Wt 243.0 lb

## 2015-03-19 DIAGNOSIS — Z3483 Encounter for supervision of other normal pregnancy, third trimester: Secondary | ICD-10-CM

## 2015-03-19 DIAGNOSIS — R52 Pain, unspecified: Secondary | ICD-10-CM

## 2015-03-19 LAB — POCT URINALYSIS DIPSTICK
Bilirubin, UA: NEGATIVE
Blood, UA: NEGATIVE
Glucose, UA: NEGATIVE
Ketones, UA: NEGATIVE
Leukocytes, UA: NEGATIVE
NITRITE UA: NEGATIVE
PH UA: 6.5
Protein, UA: NEGATIVE
Spec Grav, UA: 1.01
UROBILINOGEN UA: NEGATIVE

## 2015-03-19 MED ORDER — OXYCODONE HCL 10 MG PO TABS: 10.0000 mg | ORAL_TABLET | Freq: Four times a day (QID) | ORAL | Status: DC | PRN

## 2015-03-20 ENCOUNTER — Encounter: Payer: Self-pay | Admitting: Obstetrics

## 2015-03-20 ENCOUNTER — Other Ambulatory Visit: Payer: Self-pay | Admitting: *Deleted

## 2015-03-20 NOTE — Progress Notes (Signed)
Subjective:    Elaine Martin is a 29 y.o. female being seen today for her obstetrical visit. She is at 7152w2d gestation. Patient reports backache. Fetal movement: normal.  Problem List Items Addressed This Visit    None    Visit Diagnoses    Encounter for supervision of other normal pregnancy in third trimester    -  Primary    Relevant Orders    POCT urinalysis dipstick (Completed)    Pain        Relevant Medications    Oxycodone HCl 10 MG TABS      Patient Active Problem List   Diagnosis Date Noted  . Encounter for fetal anatomic survey   . [redacted] weeks gestation of pregnancy   . Obesity affecting pregnancy   . BV (bacterial vaginosis) 05/08/2014    Objective:    BP 126/88 mmHg  Pulse 87  Temp(Src) 98.4 F (36.9 C)  Wt 243 lb (110.224 kg)  LMP 07/02/2014 FHT: 150 BPM  Uterine Size: size equals dates  Presentations: unsure  Pelvic Exam: Deferred    Assessment:    Pregnancy @ 452w2d weeks    Previous C/S.  Desires repeat C/S.  Plan:   Plans for delivery: C/Section scheduled; labs reviewed; problem list updated Counseling: Consent signed. Infant feeding: plans to breastfeed. Cigarette smoking: former smoker. L&D discussion: symptoms of labor, discussed when to call, discussed what number to call, anesthetic/analgesic options reviewed and delivering clinician:  plans Physician. Postpartum supports and preparation: circumcision discussed and contraception plans discussed.  Follow up in 1 Week.

## 2015-03-22 ENCOUNTER — Inpatient Hospital Stay (HOSPITAL_COMMUNITY)
Admission: AD | Admit: 2015-03-22 | Payer: Managed Care, Other (non HMO) | Source: Ambulatory Visit | Admitting: Obstetrics

## 2015-03-22 ENCOUNTER — Inpatient Hospital Stay (HOSPITAL_COMMUNITY): Payer: Managed Care, Other (non HMO) | Admitting: Anesthesiology

## 2015-03-22 ENCOUNTER — Encounter (HOSPITAL_COMMUNITY): Admission: AD | Disposition: A | Discharge status: Home / Self Care | Payer: Self-pay | Attending: Obstetrics

## 2015-03-22 ENCOUNTER — Encounter (HOSPITAL_COMMUNITY): Payer: Self-pay | Admitting: *Deleted

## 2015-03-22 ENCOUNTER — Inpatient Hospital Stay (HOSPITAL_COMMUNITY)
Admission: AD | Admit: 2015-03-22 | Discharge: 2015-03-25 | DRG: 766 | Disposition: A | Discharge status: Home / Self Care | Payer: Managed Care, Other (non HMO) | Source: Intra-hospital | Attending: Obstetrics | Admitting: Obstetrics

## 2015-03-22 DIAGNOSIS — O321XX0 Maternal care for breech presentation, not applicable or unspecified: Secondary | ICD-10-CM | POA: Diagnosis present

## 2015-03-22 DIAGNOSIS — O99824 Streptococcus B carrier state complicating childbirth: Secondary | ICD-10-CM | POA: Diagnosis present

## 2015-03-22 DIAGNOSIS — O4700 False labor before 37 completed weeks of gestation, unspecified trimester: Secondary | ICD-10-CM

## 2015-03-22 DIAGNOSIS — Z98891 History of uterine scar from previous surgery: Secondary | ICD-10-CM

## 2015-03-22 DIAGNOSIS — O3421 Maternal care for scar from previous cesarean delivery: Principal | ICD-10-CM | POA: Diagnosis present

## 2015-03-22 DIAGNOSIS — Z3A37 37 weeks gestation of pregnancy: Secondary | ICD-10-CM | POA: Diagnosis present

## 2015-03-22 LAB — CBC
HCT: 34 % — ABNORMAL LOW (ref 36.0–46.0)
Hemoglobin: 12 g/dL (ref 12.0–15.0)
MCH: 30.8 pg (ref 26.0–34.0)
MCHC: 35.3 g/dL (ref 30.0–36.0)
MCV: 87.2 fL (ref 78.0–100.0)
PLATELETS: 233 10*3/uL (ref 150–400)
RBC: 3.9 MIL/uL (ref 3.87–5.11)
RDW: 13.1 % (ref 11.5–15.5)
WBC: 12.5 10*3/uL — ABNORMAL HIGH (ref 4.0–10.5)

## 2015-03-22 SURGERY — Surgical Case
Anesthesia: General | Site: Abdomen

## 2015-03-22 MED ORDER — FAMOTIDINE IN NACL 20-0.9 MG/50ML-% IV SOLN
20.0000 mg | Freq: Once | INTRAVENOUS | Status: AC
  Administered 2015-03-22: 20 mg via INTRAVENOUS
  Filled 2015-03-22: qty 50

## 2015-03-22 MED ORDER — BUPIVACAINE IN DEXTROSE 0.75-8.25 % IT SOLN
INTRATHECAL | Status: DC | PRN
  Administered 2015-03-22: 1.6 mL via INTRATHECAL

## 2015-03-22 MED ORDER — OXYTOCIN 10 UNIT/ML IJ SOLN
INTRAMUSCULAR | Status: AC
  Filled 2015-03-22: qty 4

## 2015-03-22 MED ORDER — ONDANSETRON HCL 4 MG/2ML IJ SOLN
INTRAMUSCULAR | Status: AC
  Filled 2015-03-22: qty 2

## 2015-03-22 MED ORDER — LACTATED RINGERS IV BOLUS (SEPSIS): 1000.0000 mL | Freq: Once | INTRAVENOUS | Status: DC

## 2015-03-22 MED ORDER — SODIUM CHLORIDE 0.9 % IV SOLN
2.0000 g | Freq: Once | INTRAVENOUS | Status: DC
  Filled 2015-03-22: qty 2000

## 2015-03-22 MED ORDER — FENTANYL CITRATE (PF) 100 MCG/2ML IJ SOLN
INTRAMUSCULAR | Status: AC
  Filled 2015-03-22: qty 2

## 2015-03-22 MED ORDER — LACTATED RINGERS IV SOLN
INTRAVENOUS | Status: DC
  Administered 2015-03-22: via INTRAVENOUS

## 2015-03-22 MED ORDER — CEFAZOLIN SODIUM-DEXTROSE 2-3 GM-% IV SOLR: 2.0000 g | Freq: Once | INTRAVENOUS | Status: DC

## 2015-03-22 MED ORDER — KETOROLAC TROMETHAMINE 30 MG/ML IJ SOLN
30.0000 mg | Freq: Four times a day (QID) | INTRAMUSCULAR | Status: DC | PRN
Start: 2015-03-22 — End: 2015-03-23
  Administered 2015-03-23: 30 mg via INTRAVENOUS

## 2015-03-22 MED ORDER — MORPHINE SULFATE (PF) 0.5 MG/ML IJ SOLN
INTRAMUSCULAR | Status: DC | PRN
  Administered 2015-03-22: .2 mg via INTRATHECAL

## 2015-03-22 MED ORDER — CEFAZOLIN SODIUM-DEXTROSE 2-3 GM-% IV SOLR
INTRAVENOUS | Status: DC | PRN
  Administered 2015-03-22: 2 g via INTRAVENOUS

## 2015-03-22 MED ORDER — KETOROLAC TROMETHAMINE 30 MG/ML IJ SOLN: 30.0000 mg | Freq: Four times a day (QID) | INTRAMUSCULAR | Status: DC | PRN

## 2015-03-22 MED ORDER — MORPHINE SULFATE 0.5 MG/ML IJ SOLN
INTRAMUSCULAR | Status: AC
  Filled 2015-03-22: qty 10

## 2015-03-22 MED ORDER — PHENYLEPHRINE 8 MG IN D5W 100 ML (0.08MG/ML) PREMIX OPTIME
INJECTION | INTRAVENOUS | Status: AC
  Filled 2015-03-22: qty 100

## 2015-03-22 MED ORDER — FENTANYL CITRATE (PF) 100 MCG/2ML IJ SOLN
INTRAMUSCULAR | Status: DC | PRN
  Administered 2015-03-22: 20 ug via INTRATHECAL

## 2015-03-22 MED ORDER — CEFAZOLIN SODIUM-DEXTROSE 2-3 GM-% IV SOLR: 2.0000 g | Freq: Three times a day (TID) | INTRAVENOUS | Status: DC

## 2015-03-22 MED ORDER — CITRIC ACID-SODIUM CITRATE 334-500 MG/5ML PO SOLN
30.0000 mL | Freq: Once | ORAL | Status: AC
  Administered 2015-03-22: 30 mL via ORAL
  Filled 2015-03-22: qty 15

## 2015-03-22 SURGICAL SUPPLY — 35 items
CLAMP CORD UMBIL (MISCELLANEOUS) IMPLANT
CLOTH BEACON ORANGE TIMEOUT ST (SAFETY) ×3 IMPLANT
CONTAINER PREFILL 10% NBF 15ML (MISCELLANEOUS) ×6 IMPLANT
DRAPE SHEET LG 3/4 BI-LAMINATE (DRAPES) IMPLANT
DRSG OPSITE POSTOP 4X10 (GAUZE/BANDAGES/DRESSINGS) ×3 IMPLANT
DURAPREP 26ML APPLICATOR (WOUND CARE) ×3 IMPLANT
ELECT REM PT RETURN 9FT ADLT (ELECTROSURGICAL) ×3
ELECTRODE REM PT RTRN 9FT ADLT (ELECTROSURGICAL) ×1 IMPLANT
EXTRACTOR VACUUM M CUP 4 TUBE (SUCTIONS) IMPLANT
EXTRACTOR VACUUM M CUP 4' TUBE (SUCTIONS)
GLOVE BIO SURGEON STRL SZ8 (GLOVE) ×3 IMPLANT
GOWN STRL REUS W/TWL LRG LVL3 (GOWN DISPOSABLE) ×6 IMPLANT
KIT ABG SYR 3ML LUER SLIP (SYRINGE) IMPLANT
LIQUID BAND (GAUZE/BANDAGES/DRESSINGS) ×1 IMPLANT
NDL HYPO 25X5/8 SAFETYGLIDE (NEEDLE) ×1 IMPLANT
NEEDLE HYPO 22GX1.5 SAFETY (NEEDLE) ×3 IMPLANT
NEEDLE HYPO 25X5/8 SAFETYGLIDE (NEEDLE) ×3 IMPLANT
NS IRRIG 1000ML POUR BTL (IV SOLUTION) ×3 IMPLANT
PACK C SECTION WH (CUSTOM PROCEDURE TRAY) ×3 IMPLANT
PAD OB MATERNITY 4.3X12.25 (PERSONAL CARE ITEMS) ×3 IMPLANT
RTRCTR C-SECT PINK 25CM LRG (MISCELLANEOUS) ×3 IMPLANT
STAPLER VISISTAT 35W (STAPLE) ×2 IMPLANT
SUT GUT PLAIN 0 CT-3 TAN 27 (SUTURE) IMPLANT
SUT MNCRL 0 VIOLET CTX 36 (SUTURE) ×3 IMPLANT
SUT MNCRL AB 4-0 PS2 18 (SUTURE) IMPLANT
SUT MON AB 2-0 CT1 27 (SUTURE) ×3 IMPLANT
SUT MON AB 3-0 SH 27 (SUTURE)
SUT MON AB 3-0 SH27 (SUTURE) IMPLANT
SUT MONOCRYL 0 CTX 36 (SUTURE) ×6
SUT PLAIN 2 0 XLH (SUTURE) IMPLANT
SUT VIC AB 0 CTX 36 (SUTURE) ×6
SUT VIC AB 0 CTX36XBRD ANBCTRL (SUTURE) ×2 IMPLANT
SYR CONTROL 10ML LL (SYRINGE) ×3 IMPLANT
TOWEL OR 17X24 6PK STRL BLUE (TOWEL DISPOSABLE) ×3 IMPLANT
TRAY FOLEY CATH SILVER 14FR (SET/KITS/TRAYS/PACK) ×3 IMPLANT

## 2015-03-22 NOTE — H&P (Signed)
This is Dr. Francoise CeoBernard Marshall dictating the history and physical on  Elaine Martin she's a 29 year old gravida 3 para 1011 at 37 weeks and 4 days EDC 6:15 positive GBS membranes intact who had a previous C-section she is now in labor 7 cm with a breech presentation and desires repeat C-section Past medical history negative Social history negative Past surgical history she had a C-section in the past Family history negative for diabetes I blood pressure System review tonight her blood pressure is elevated her diastolic is over 409100 she has no history of headache or epigastric pain otherwise system review negative Physical exam well-developed female in labor HEENT negative Lungs clear to P&A Heart regular rhythm no murmurs no gallops Breasts negative Abdomen term Pelvic as described above Extremities negative dictated by Dr. Laurell JosephsBurke Dictated by Dr. Francoise CeoBernard Marshall

## 2015-03-22 NOTE — MAU Note (Signed)
Stated ctx started about 515 tonight. Denies SROM or bleeding good fetal movement reported.

## 2015-03-22 NOTE — Anesthesia Preprocedure Evaluation (Signed)
Anesthesia Evaluation  Patient identified by MRN, date of birth, ID band Patient awake    Reviewed: Allergy & Precautions, NPO status , Patient's Chart, lab work & pertinent test results  History of Anesthesia Complications Negative for: history of anesthetic complications  Airway Mallampati: III  TM Distance: >3 FB Neck ROM: Full    Dental  (+) Teeth Intact   Pulmonary neg shortness of breath, neg sleep apnea, neg COPDneg recent URI, former smoker,  breath sounds clear to auscultation        Cardiovascular negative cardio ROS  Rhythm:Regular     Neuro/Psych negative neurological ROS  negative psych ROS   GI/Hepatic negative GI ROS, Neg liver ROS,   Endo/Other  negative endocrine ROS  Renal/GU negative Renal ROS     Musculoskeletal   Abdominal   Peds  Hematology negative hematology ROS (+)   Anesthesia Other Findings   Reproductive/Obstetrics (+) Pregnancy                             Anesthesia Physical Anesthesia Plan  ASA: II and emergent  Anesthesia Plan: Spinal and General   Post-op Pain Management:    Induction: Rapid sequence, Cricoid pressure planned and Intravenous  Airway Management Planned: Oral ETT  Additional Equipment: None  Intra-op Plan:   Post-operative Plan: Extubation in OR  Informed Consent: I have reviewed the patients History and Physical, chart, labs and discussed the procedure including the risks, benefits and alternatives for the proposed anesthesia with the patient or authorized representative who has indicated his/her understanding and acceptance.   Dental advisory given  Plan Discussed with: CRNA and Surgeon  Anesthesia Plan Comments:         Anesthesia Quick Evaluation

## 2015-03-23 ENCOUNTER — Encounter (HOSPITAL_COMMUNITY): Payer: Self-pay | Admitting: *Deleted

## 2015-03-23 DIAGNOSIS — Z98891 History of uterine scar from previous surgery: Secondary | ICD-10-CM

## 2015-03-23 DIAGNOSIS — O3421 Maternal care for scar from previous cesarean delivery: Secondary | ICD-10-CM | POA: Diagnosis present

## 2015-03-23 DIAGNOSIS — O99824 Streptococcus B carrier state complicating childbirth: Secondary | ICD-10-CM | POA: Diagnosis present

## 2015-03-23 DIAGNOSIS — Z3A37 37 weeks gestation of pregnancy: Secondary | ICD-10-CM | POA: Diagnosis present

## 2015-03-23 DIAGNOSIS — Z3483 Encounter for supervision of other normal pregnancy, third trimester: Secondary | ICD-10-CM | POA: Diagnosis present

## 2015-03-23 DIAGNOSIS — O321XX0 Maternal care for breech presentation, not applicable or unspecified: Secondary | ICD-10-CM | POA: Diagnosis present

## 2015-03-23 LAB — COMPREHENSIVE METABOLIC PANEL
ALT: 16 U/L (ref 14–54)
ANION GAP: 8 (ref 5–15)
AST: 23 U/L (ref 15–41)
Albumin: 3.2 g/dL — ABNORMAL LOW (ref 3.5–5.0)
Alkaline Phosphatase: 137 U/L — ABNORMAL HIGH (ref 38–126)
BUN: 11 mg/dL (ref 6–20)
CO2: 19 mmol/L — ABNORMAL LOW (ref 22–32)
CREATININE: 0.7 mg/dL (ref 0.44–1.00)
Calcium: 8.8 mg/dL — ABNORMAL LOW (ref 8.9–10.3)
Chloride: 106 mmol/L (ref 101–111)
Glucose, Bld: 101 mg/dL — ABNORMAL HIGH (ref 65–99)
Potassium: 3.7 mmol/L (ref 3.5–5.1)
Sodium: 133 mmol/L — ABNORMAL LOW (ref 135–145)
TOTAL PROTEIN: 6.7 g/dL (ref 6.5–8.1)
Total Bilirubin: 0.3 mg/dL (ref 0.3–1.2)

## 2015-03-23 LAB — URIC ACID: Uric Acid, Serum: 5.4 mg/dL (ref 2.3–6.6)

## 2015-03-23 LAB — LACTATE DEHYDROGENASE: LDH: 164 U/L (ref 98–192)

## 2015-03-23 LAB — RPR: RPR Ser Ql: NONREACTIVE

## 2015-03-23 MED ORDER — DIPHENHYDRAMINE HCL 25 MG PO CAPS
25.0000 mg | ORAL_CAPSULE | ORAL | Status: DC | PRN
  Administered 2015-03-23 (×2): 25 mg via ORAL
  Filled 2015-03-23: qty 1

## 2015-03-23 MED ORDER — KETOROLAC TROMETHAMINE 30 MG/ML IJ SOLN
INTRAMUSCULAR | Status: AC
  Filled 2015-03-23: qty 1

## 2015-03-23 MED ORDER — OXYTOCIN 40 UNITS IN LACTATED RINGERS INFUSION - SIMPLE MED
INTRAVENOUS | Status: AC
  Administered 2015-03-23: 62.5 mL/h via INTRAVENOUS
  Filled 2015-03-23: qty 1000

## 2015-03-23 MED ORDER — NALBUPHINE HCL 10 MG/ML IJ SOLN: 5.0000 mg | INTRAMUSCULAR | Status: DC | PRN

## 2015-03-23 MED ORDER — LANOLIN HYDROUS EX OINT: 1.0000 "application " | TOPICAL_OINTMENT | CUTANEOUS | Status: DC | PRN

## 2015-03-23 MED ORDER — NITROGLYCERIN 0.4 MG/SPRAY TL SOLN
Status: AC
  Filled 2015-03-23: qty 4.9

## 2015-03-23 MED ORDER — DIPHENHYDRAMINE HCL 50 MG/ML IJ SOLN: 12.5000 mg | INTRAMUSCULAR | Status: DC | PRN

## 2015-03-23 MED ORDER — SENNOSIDES-DOCUSATE SODIUM 8.6-50 MG PO TABS
2.0000 | ORAL_TABLET | ORAL | Status: DC
  Administered 2015-03-23 – 2015-03-25 (×2): 2 via ORAL
  Filled 2015-03-23 (×2): qty 2

## 2015-03-23 MED ORDER — NALOXONE HCL 0.4 MG/ML IJ SOLN: 0.4000 mg | INTRAMUSCULAR | Status: DC | PRN

## 2015-03-23 MED ORDER — SIMETHICONE 80 MG PO CHEW
80.0000 mg | CHEWABLE_TABLET | Freq: Three times a day (TID) | ORAL | Status: DC
  Administered 2015-03-23 – 2015-03-25 (×7): 80 mg via ORAL
  Filled 2015-03-23 (×8): qty 1

## 2015-03-23 MED ORDER — DIBUCAINE 1 % RE OINT: 1.0000 "application " | TOPICAL_OINTMENT | RECTAL | Status: DC | PRN

## 2015-03-23 MED ORDER — MEPERIDINE HCL 25 MG/ML IJ SOLN
INTRAMUSCULAR | Status: AC
  Filled 2015-03-23: qty 1

## 2015-03-23 MED ORDER — OXYCODONE-ACETAMINOPHEN 5-325 MG PO TABS: 2.0000 | ORAL_TABLET | ORAL | Status: DC | PRN

## 2015-03-23 MED ORDER — WITCH HAZEL-GLYCERIN EX PADS: 1.0000 "application " | MEDICATED_PAD | CUTANEOUS | Status: DC | PRN

## 2015-03-23 MED ORDER — DIPHENHYDRAMINE HCL 25 MG PO CAPS
25.0000 mg | ORAL_CAPSULE | Freq: Four times a day (QID) | ORAL | Status: DC | PRN
  Filled 2015-03-23: qty 1

## 2015-03-23 MED ORDER — ONDANSETRON HCL 4 MG/2ML IJ SOLN
INTRAMUSCULAR | Status: AC
  Filled 2015-03-23: qty 2

## 2015-03-23 MED ORDER — TETANUS-DIPHTH-ACELL PERTUSSIS 5-2.5-18.5 LF-MCG/0.5 IM SUSP
0.5000 mL | Freq: Once | INTRAMUSCULAR | Status: AC
  Administered 2015-03-24: 0.5 mL via INTRAMUSCULAR
  Filled 2015-03-23: qty 0.5

## 2015-03-23 MED ORDER — NALBUPHINE HCL 10 MG/ML IJ SOLN
5.0000 mg | INTRAMUSCULAR | Status: DC | PRN
  Administered 2015-03-23: 5 mg via INTRAVENOUS
  Filled 2015-03-23: qty 1

## 2015-03-23 MED ORDER — IBUPROFEN 600 MG PO TABS
600.0000 mg | ORAL_TABLET | Freq: Four times a day (QID) | ORAL | Status: DC
  Administered 2015-03-23 – 2015-03-25 (×9): 600 mg via ORAL
  Filled 2015-03-23 (×9): qty 1

## 2015-03-23 MED ORDER — ONDANSETRON HCL 4 MG/2ML IJ SOLN: 4.0000 mg | Freq: Three times a day (TID) | INTRAMUSCULAR | Status: DC | PRN

## 2015-03-23 MED ORDER — MAGNESIUM SULFATE 50 % IJ SOLN
2.0000 g/h | INTRAVENOUS | Status: DC
  Filled 2015-03-23: qty 80

## 2015-03-23 MED ORDER — MEPERIDINE HCL 25 MG/ML IJ SOLN: 6.2500 mg | INTRAMUSCULAR | Status: DC | PRN

## 2015-03-23 MED ORDER — SIMETHICONE 80 MG PO CHEW: 80.0000 mg | CHEWABLE_TABLET | ORAL | Status: DC | PRN

## 2015-03-23 MED ORDER — SCOPOLAMINE 1 MG/3DAYS TD PT72
MEDICATED_PATCH | TRANSDERMAL | Status: AC
  Filled 2015-03-23: qty 1

## 2015-03-23 MED ORDER — SCOPOLAMINE 1 MG/3DAYS TD PT72
1.0000 | MEDICATED_PATCH | Freq: Once | TRANSDERMAL | Status: DC
  Filled 2015-03-23: qty 1

## 2015-03-23 MED ORDER — OXYTOCIN 10 UNIT/ML IJ SOLN
40.0000 [IU] | INTRAMUSCULAR | Status: DC | PRN
  Administered 2015-03-23: 40 [IU] via INTRAVENOUS

## 2015-03-23 MED ORDER — NITROGLYCERIN IN D5W 200-5 MCG/ML-% IV SOLN
INTRAVENOUS | Status: AC
  Filled 2015-03-23: qty 250

## 2015-03-23 MED ORDER — OXYCODONE-ACETAMINOPHEN 5-325 MG PO TABS
1.0000 | ORAL_TABLET | ORAL | Status: DC | PRN
  Administered 2015-03-23 – 2015-03-25 (×5): 1 via ORAL
  Filled 2015-03-23 (×4): qty 1

## 2015-03-23 MED ORDER — LACTATED RINGERS IV SOLN: INTRAVENOUS | Status: DC

## 2015-03-23 MED ORDER — NALBUPHINE HCL 10 MG/ML IJ SOLN: 5.0000 mg | Freq: Once | INTRAMUSCULAR | Status: AC | PRN

## 2015-03-23 MED ORDER — NITROGLYCERIN 0.2 MG/ML ON CALL CATH LAB
INTRAVENOUS | Status: DC | PRN
  Administered 2015-03-22 – 2015-03-23 (×3): 200 ug via INTRAVENOUS

## 2015-03-23 MED ORDER — OXYTOCIN 10 UNIT/ML IJ SOLN
INTRAMUSCULAR | Status: AC
  Filled 2015-03-23: qty 4

## 2015-03-23 MED ORDER — MEPERIDINE HCL 25 MG/ML IJ SOLN
INTRAMUSCULAR | Status: DC | PRN
  Administered 2015-03-23: 12.5 mg via INTRAVENOUS

## 2015-03-23 MED ORDER — PRENATAL MULTIVITAMIN CH
1.0000 | ORAL_TABLET | Freq: Every day | ORAL | Status: DC
Start: 2015-03-23 — End: 2015-03-25
  Administered 2015-03-23 – 2015-03-25 (×3): 1 via ORAL
  Filled 2015-03-23 (×3): qty 1

## 2015-03-23 MED ORDER — PNEUMOCOCCAL VAC POLYVALENT 25 MCG/0.5ML IJ INJ
0.5000 mL | INJECTION | INTRAMUSCULAR | Status: AC
  Administered 2015-03-25: 0.5 mL via INTRAMUSCULAR
  Filled 2015-03-23: qty 0.5

## 2015-03-23 MED ORDER — SODIUM CHLORIDE 0.9 % IJ SOLN
3.0000 mL | INTRAMUSCULAR | Status: DC | PRN
  Administered 2015-03-23: 3 mL via INTRAVENOUS
  Filled 2015-03-23: qty 3

## 2015-03-23 MED ORDER — ONDANSETRON HCL 4 MG/2ML IJ SOLN
INTRAMUSCULAR | Status: DC | PRN
  Administered 2015-03-23: 4 mg via INTRAVENOUS

## 2015-03-23 MED ORDER — MENTHOL 3 MG MT LOZG: 1.0000 | LOZENGE | OROMUCOSAL | Status: DC | PRN

## 2015-03-23 MED ORDER — ZOLPIDEM TARTRATE 5 MG PO TABS: 5.0000 mg | ORAL_TABLET | Freq: Every evening | ORAL | Status: DC | PRN

## 2015-03-23 MED ORDER — NALOXONE HCL 1 MG/ML IJ SOLN
1.0000 ug/kg/h | INTRAVENOUS | Status: DC | PRN
  Filled 2015-03-23: qty 2

## 2015-03-23 MED ORDER — MAGNESIUM SULFATE BOLUS VIA INFUSION
4.0000 g | Freq: Once | INTRAVENOUS | Status: DC
  Filled 2015-03-23: qty 500

## 2015-03-23 MED ORDER — ACETAMINOPHEN 325 MG PO TABS: 650.0000 mg | ORAL_TABLET | ORAL | Status: DC | PRN

## 2015-03-23 MED ORDER — SIMETHICONE 80 MG PO CHEW
80.0000 mg | CHEWABLE_TABLET | ORAL | Status: DC
  Administered 2015-03-23 – 2015-03-25 (×2): 80 mg via ORAL
  Filled 2015-03-23 (×2): qty 1

## 2015-03-23 MED ORDER — OXYTOCIN 40 UNITS IN LACTATED RINGERS INFUSION - SIMPLE MED
62.5000 mL/h | INTRAVENOUS | Status: AC
  Administered 2015-03-23: 62.5 mL/h via INTRAVENOUS

## 2015-03-23 MED ORDER — FENTANYL CITRATE (PF) 100 MCG/2ML IJ SOLN: 25.0000 ug | INTRAMUSCULAR | Status: DC | PRN

## 2015-03-23 NOTE — Addendum Note (Signed)
Addendum  created 03/23/15 [id number] by Graciela HusbandsWynn O Cailey Trigueros, CRNA   Modules edited: Notes Section   Notes Section:  File: [id number]

## 2015-03-23 NOTE — Anesthesia Postprocedure Evaluation (Signed)
  Anesthesia Post-op Note  Patient: Elaine Martin  Procedure(s) Performed: Procedure(s): REPEAT CESAREAN SECTION (N/A)  Patient Location: PACU  Anesthesia Type:Spinal  Level of Consciousness: awake  Airway and Oxygen Therapy: Patient Spontanous Breathing  Post-op Pain: mild  Post-op Assessment: Post-op Vital signs reviewed, Patient's Cardiovascular Status Stable, Respiratory Function Stable, Patent Airway, No signs of Nausea or vomiting and Pain level controlled  Post-op Vital Signs: Reviewed and stable  Last Vitals:  Filed Vitals:   03/23/15 0530  BP: 125/51  Pulse: 69  Temp: 36.7 C  Resp: 20    Complications: No apparent anesthesia complications

## 2015-03-23 NOTE — Addendum Note (Signed)
Addendum  created 03/23/15 [id number] by Corky Soxhris Regene Mccarthy, MD   Modules edited: Anesthesia Blocks and Procedures, Clinical Notes   Clinical Notes:  File: [id number]

## 2015-03-23 NOTE — Op Note (Signed)
Preop diagnosis previous cesarean section at 37+ weeks in labor desires repeat C-section Postop diagnosis the same Anesthesia spinal Surgeon Dr. Francoise CeoBernard Marshall Procedure patient placed on the operating table in the supine position abdomen prepped and draped bladder  Emptied  with a Foley catheter a transverse suprapubic incision made through the old scar carried down to the rectus fascia fascia cleaned and incised the length of the incision recti muscles retracted laterally peritoneum incised longitudinally transverse incision made on the visceroperitoneum above the bladder bladder mobilized inferiorly transverse lower uterine incision made fluid meconium-stained patient delivered from the LOA position vertex female Apgar 6 and 8 placenta was anterior fundal removed manually and sent to labor and delivery   uterine cavity clean with dry laps uterine incision closed in one layer with continuous suture #1 chromic hemostasis satisfactory  Bladder flap reattached with 2-0 chromic uterus well contracted abdomen closed in layers peritoneum continuous with of 0 chromic fascia continuous table Skin closed with staples blood loss thousand cc patient tolerated the procedure well Dictated by Dr. Francoise CeoBernard Marshall

## 2015-03-23 NOTE — Anesthesia Postprocedure Evaluation (Signed)
Anesthesia Post Note  Patient: Elaine Martin  Procedure(s) Performed: Procedure(s) (LRB): REPEAT CESAREAN SECTION (N/A)  Anesthesia type: Spinal  Patient location: Mother/Baby  Post pain: Pain level controlled  Post assessment: Post-op Vital signs reviewed  Last Vitals:  Filed Vitals:   03/23/15 0630  BP: 124/62  Pulse: 66  Temp: 36.8 C  Resp: 20    Post vital signs: Reviewed  Level of consciousness: awake  Complications: No apparent anesthesia complications

## 2015-03-23 NOTE — Lactation Note (Signed)
This note was copied from the chart of Elaine Lakendria Luckman. Lactation Consultation Note  Patient Name: Elaine Martin NWGNF'AToday's Date: 03/23/2015 Reason for consult: Initial assessment;Infant < 6lbs Mom reports she feels baby is nursing well. Has been to the breast numerous times in the 1st 15 hours of life. Mom did not BF her 1st child. Basic teaching reviewed. Advised baby should be at the breast 8-12 times in 24 hours and with feeding ques. Lactation brochure left for review, advised of OP services and support group. Encouraged Mom to call for assist as needed, questions/concerns.   Maternal Data Has patient been taught Hand Expression?: No (Mom reports RN demonstrated hand expression, declined LC to review) Does the patient have breastfeeding experience prior to this delivery?: No  Feeding Feeding Type: Breast Fed Length of feed: 60 min  LATCH Score/Interventions                      Lactation Tools Discussed/Used     Consult Status Consult Status: Follow-up Date: 03/24/15 Follow-up type: In-patient    Elaine Martin, Elaine Martin 03/23/2015, 3:26 PM

## 2015-03-23 NOTE — Transfer of Care (Signed)
Immediate Anesthesia Transfer of Care Note  Patient: Elaine Martin  Procedure(s) Performed: Procedure(s): REPEAT CESAREAN SECTION (N/A)  Patient Location: PACU  Anesthesia Type:Spinal  Level of Consciousness: awake, alert  and oriented  Airway & Oxygen Therapy: Patient Spontanous Breathing  Post-op Assessment: Report given to RN and Post -op Vital signs reviewed and stable  Post vital signs: Reviewed and stable  Last Vitals:  Filed Vitals:   03/22/15 2305  BP: 142/104  Pulse: 103  Resp: 18    Complications: No apparent anesthesia complications

## 2015-03-23 NOTE — Anesthesia Procedure Notes (Signed)
Spinal Patient location during procedure: OB Staffing Anesthesiologist: [place] Nol[place], CHRIS Preanesthetic Checklist Completed: patient identified, surgical consent, pre-op evaluation, timeout performed, IV checked, risks and benefits discussed and monitors and equipment checked Spinal Block Patient position: sitting Prep: site prepped and draped and DuraPrep Patient monitoring: heart rate, cardiac monitor, continuous pulse ox and blood pressure Approach: midline Location: L3-4 Injection technique: single-shot Needle Needle type: Pencan  Needle gauge: 24 G Needle length: 10 cm Assessment Sensory level: T4   

## 2015-03-24 ENCOUNTER — Encounter (HOSPITAL_COMMUNITY): Payer: Self-pay | Admitting: Obstetrics

## 2015-03-24 NOTE — Lactation Note (Addendum)
This note was copied from the chart of Boy Brita Lafountain. Lactation Consultation Note  P2, First time breastfeeding. 5lb 10 oz baby 4.3% weight loss went for 7 hours last night without latching. Mother states she attempted during the night and he would not latch. Baby has been latching more often and for longer periods today. Observed baby latching easily in football hold.  Sucks and swallows observed.  Baby bf for 20 min.  Observed for 12 min. Discussed with mother the importance of the baby feeding every 3 hours. Recommend she call if he will not latch after 3.5 hours to assistance and he may have to be spoon fed and we may start her pumping. Discussed feeding behavior and encouraged STS. Mom encouraged to feed baby every 3 hours and with feeding cues.  Mother's left nipple is sore.  Provided mother with comfort gels and discussed applying ebm.       Patient Name: Boy Fuller MandrilKia Chasen AVWUJ'WToday's Date: 03/24/2015 Reason for consult: Follow-up assessment   Maternal Data    Feeding Feeding Type: Breast Fed  LATCH Score/Interventions Latch: Grasps breast easily, tongue down, lips flanged, rhythmical sucking. Intervention(s): Breast compression  Audible Swallowing: A few with stimulation  Type of Nipple: Everted at rest and after stimulation  Comfort (Breast/Nipple): Filling, red/small blisters or bruises, mild/mod discomfort     Hold (Positioning): No assistance needed to correctly position infant at breast.  LATCH Score: 8  Lactation Tools Discussed/Used     Consult Status Consult Status: Follow-up Date: 03/25/15 Follow-up type: In-patient    Dahlia ByesBerkelhammer, Ruth [facility] 03/24/2015, 2:06 PM

## 2015-03-24 NOTE — Progress Notes (Addendum)
Subjective: Postpartum Day #1: Cesarean Delivery Patient reports tolerating PO, + flatus and no problems voiding.  Breastfeeding is going well.    Objective: Vital signs in last 24 hours: Temp:  [98.4 F (36.9 C)-98.7 F (37.1 C)] 98.7 F (37.1 C) (05/30 2243) Pulse Rate:  [70-77] 72 (05/31 0554) Resp:  [18-20] 18 (05/31 0554) BP: (113-137)/(59-84) 137/74 mmHg (05/31 0554) SpO2:  [98 %-100 %] 100 % (05/31 0554)  Physical Exam:  General: alert, cooperative and no distress Lochia: appropriate Uterine Fundus: firm Incision: no significant drainage DVT Evaluation: No evidence of DVT seen on physical exam.   Recent Labs  03/22/15 2311  HGB 12.0  HCT 34.0*    Assessment/Plan: Status post Cesarean section. Doing well postoperatively.  Continue current care. PP contraception plans: POP, wants to start Tri-Sprintec after she stops breastfeeding.   Junius Faucett A Maziah Keeling 03/24/2015, 5:42 PM

## 2015-03-25 MED ORDER — SIMETHICONE 80 MG PO CHEW
80.0000 mg | CHEWABLE_TABLET | ORAL | Status: DC
Start: 2015-03-25 — End: 2015-07-29

## 2015-03-25 MED ORDER — WITCH HAZEL-GLYCERIN EX PADS: 1.0000 "application " | MEDICATED_PAD | CUTANEOUS | Status: DC | PRN

## 2015-03-25 MED ORDER — DIBUCAINE 1 % RE OINT: 1.0000 "application " | TOPICAL_OINTMENT | RECTAL | Status: DC | PRN

## 2015-03-25 MED ORDER — ABDOMINAL BINDER/ELASTIC MED MISC: 1.0000 "application " | Freq: Every day | Status: DC

## 2015-03-25 MED ORDER — OXYCODONE-ACETAMINOPHEN 5-325 MG PO TABS: 2.0000 | ORAL_TABLET | ORAL | Status: DC | PRN

## 2015-03-25 MED ORDER — SENNOSIDES-DOCUSATE SODIUM 8.6-50 MG PO TABS: 2.0000 | ORAL_TABLET | ORAL | Status: DC

## 2015-03-25 MED ORDER — LANOLIN HYDROUS EX OINT: 1.0000 "application " | TOPICAL_OINTMENT | CUTANEOUS | Status: DC | PRN

## 2015-03-25 MED ORDER — IBUPROFEN 600 MG PO TABS: 600.0000 mg | ORAL_TABLET | Freq: Four times a day (QID) | ORAL | Status: DC

## 2015-03-25 NOTE — Discharge Instructions (Signed)
Cesarean Section (Postpartum Care) °These discharge instructions provide you with general information on cesarean section (caesarean, cesarian, caesarian) and caring for yourself after you leave the hospital. Your caregiver may also give you specific instructions. °Please read these instructions and refer to them in the next few weeks. If you have any questions regarding these instructions, you may call the nursing unit. If you have any problems after discharge, please call your doctor. If you are unable to reach your doctor, you should seek help at the nearest Emergency Department. °ACTIVITY °· Rest as much as possible the first two weeks at home.  °· When possible, have someone help you with your household activities and the new baby for 2 to 3 weeks.  °· Limit your housework and social activity. Increase your activity gradually as your strength returns.  °· Do not climb stairs more than two or three times a day.  °· Do not lift anything heavier than your baby.  °· Follow your doctor's instructions about driving a car.  °· Limit wearing support panties or control-top hose, since relying on your own muscles helps strengthen them.  °· Ask your doctor about exercises.  °NUTRITION °· You may return to your usual diet.  °· Drink 6 to 8 glasses of fluid a day.  °· Eat a well-balanced diet. Include portions of food from the meat/protein, milk, fruit, vegetable and bread groups.  °· Keep taking your prenatal or multivitamins.  °ELIMINATION °You should return to your usual bowel function. If constipation is a problem, you may take a mild laxative such as Milk of MagnesiaÔ with your caregiver’s permission. Gradually add fruit, vegetables and bran to your diet. Make sure to increase your fluids. °HYGIENE °You may shower, wash your hair and take tub baths unless your doctor tells you otherwise. Continue peri-care until your vaginal bleeding and discharge stops. Do not douche or use tampons until your caregiver says it is  OK. °FEVER °If you feel feverish or have shaking chills, take your temperature. If your temperature is 101° F (38.3° C), or is 100.4° F (38° C) two times in a four hour period, call your doctor. The fever may indicate infection. If you call early, infection can be treated with medicines that kill germs (antibiotics). Hospitalization may be avoided. °PAIN CONTROL °You may still have mild discomfort. Only take over-the-counter or prescription medicine as directed by your caregiver. Do not take aspirin; it can cause bleeding. If the pain is not relieved by your medicine or becomes worse, call your caregiver. °INCISION CARE °Clean your cut (incision) gently with soap and water. If your caregiver says it is okay, leave the incision without a dressing unless it is draining or irritated. If you have small adhesive strips across the incision and they do not fall off within 7 days, carefully peel them off. Check the incision daily for increased redness, drainage, swelling or separation of skin. Call your caregiver if any of these happen. °VAGINAL CARE °You may have a vaginal discharge or bleeding for up to 6 weeks. If the vaginal discharge becomes bright red, bad smelling, heavy in amount, has blood clots or if you have burning or frequency when urinating, call your caregiver. °SEXUAL INTERCOURSE °It is best to follow your caregiver's advice about when you may safely resume sexual intercourse. Most women can begin to have intercourse two to three weeks after their baby's birth. °You can become pregnant before you have a period. If you decide to have sexual intercourse, you should use   birth control if you do not want to become pregnant right away. ° °BREAST CARE °If you are not breastfeeding and your breasts become tender, hard or leak milk, you may wear a firm fitting bra and apply ice to the breasts. If you are breastfeeding, wear a good support bra. Call your caregiver if you have breast pain, flu-like symptoms, fever or  hardness and reddening of your breasts. °POSTPARTUM BLUES °After the excitement of having the baby goes away, you may commonly have a period of low spirits or “blues." Discuss your feelings with your partner, family and friends. This may be caused by the changing hormone levels in your body. You may want to contact your caregiver if this is worrisome. °SEEK MEDICAL CARE IF: °· There is swelling, redness or increasing pain in the wound area.  °· Pus is coming from the wound.  °· You notice a bad smell from the wound or surgical dressing.  °· You have pain, redness and swelling from the intravenous site.  °· The wound is breaking open (the edges are not staying together).  °· You feel dizzy or feel like fainting.  °· You develop pain or bleeding when you urinate.  °· You develop diarrhea.  °· You develop nausea and vomiting.  °· You develop abnormal vaginal discharge.  °· You develop a rash.  °· You have any type of abnormal reaction or develop an allergy to your medication.  °· You need stronger pain medication for your pain.  °SEEK IMMEDIATE MEDICAL CARE: °· You develop a temperature of 101 or higher.  °· You develop abdominal pain.  °· You develop chest pain.  °· You develop shortness of breath.  °· You pass out.  °· You develop pain, swelling or redness of your leg.  °· You develop heavy vaginal bleeding with or without blood clots.  °Document Released: 07/02/2002 Document Re-Released: 03/30/2010 °[employer]® Patient Information ©2011 [employer]. °

## 2015-03-25 NOTE — Discharge Summary (Signed)
Obstetric Discharge Summary Reason for Admission: onset of labor and prior C-section Prenatal Procedures: none Intrapartum Procedures: cesarean: classical Postpartum Procedures: none Complications-Operative and Postpartum: none HEMOGLOBIN  Date Value Ref Range Status  03/22/2015 12.0 12.0 - 15.0 g/dL Final   HCT  Date Value Ref Range Status  03/22/2015 34.0* 36.0 - 46.0 % Final    Physical Exam:  General: alert, cooperative, no distress and onset of baby blues Lochia: appropriate Uterine Fundus: firm Incision: no significant drainage DVT Evaluation: No evidence of DVT seen on physical exam.  Discharge Diagnoses: Term Pregnancy-delivered  Discharge Information: Date: 03/25/2015 Activity: pelvic rest Diet: routine Medications: PNV, Ibuprofen, Colace and Percocet Condition: stable Instructions: refer to practice specific booklet Discharge to: home   Newborn Data: Live born female  Birth Weight: 5 lb 14.9 oz (2690 g) APGAR: 6, 8  Home with mother.  Jakira Mcfadden A Arien Benincasa 03/25/2015, 7:18 AM

## 2015-03-26 ENCOUNTER — Encounter: Payer: Self-pay | Admitting: Obstetrics

## 2015-03-26 LAB — TYPE AND SCREEN
ABO/RH(D): B POS
Antibody Screen: POSITIVE
DAT, IgG: NEGATIVE
Unit division: 0
Unit division: 0

## 2015-03-30 ENCOUNTER — Ambulatory Visit: Payer: Managed Care, Other (non HMO) | Admitting: Obstetrics

## 2015-03-31 ENCOUNTER — Other Ambulatory Visit (HOSPITAL_COMMUNITY): Payer: Managed Care, Other (non HMO)

## 2015-04-28 ENCOUNTER — Ambulatory Visit (INDEPENDENT_AMBULATORY_CARE_PROVIDER_SITE_OTHER): Payer: Managed Care, Other (non HMO) | Admitting: Obstetrics

## 2015-04-28 DIAGNOSIS — Z30011 Encounter for initial prescription of contraceptive pills: Secondary | ICD-10-CM

## 2015-04-28 MED ORDER — NORGESTIM-ETH ESTRAD TRIPHASIC 0.18/0.215/0.25 MG-35 MCG PO TABS: 1.0000 | ORAL_TABLET | Freq: Every day | ORAL | Status: DC

## 2015-04-29 ENCOUNTER — Encounter: Payer: Self-pay | Admitting: Obstetrics

## 2015-04-29 NOTE — Progress Notes (Signed)
Subjective:     Elaine Martin is a 29 y.o. female who presents for a postpartum visit. She is 6 weeks postpartum following a low cervical transverse Cesarean section. I have fully reviewed the prenatal and intrapartum course. The delivery was at 37.5 gestational weeks. Outcome: repeat cesarean section, low transverse incision. Anesthesia: spinal. Postpartum course has been normal. Baby's course has been normal. Baby is feeding by bottle - Similac Advance. Bleeding no bleeding. Bowel function is normal. Bladder function is normal. Patient is sexually active. Contraception method is none. Postpartum depression screening: negative.  Tobacco, alcohol and substance abuse history reviewed.  Adult immunizations reviewed including TDAP, rubella and varicella.  The following portions of the patient's history were reviewed and updated as appropriate: allergies, current medications, past family history, past medical history, past social history, past surgical history and problem list.  Review of Systems A comprehensive review of systems was negative.   Objective:    BP 136/84 mmHg  Pulse 84  Wt 210 lb (95.255 kg)  General:  alert and no distress   Breasts:  inspection negative, no nipple discharge or bleeding, no masses or nodularity palpable  Lungs: clear to auscultation bilaterally  Heart:  regular rate and rhythm, S1, S2 normal, no murmur, click, rub or gallop  Abdomen: normal findings: soft, non-tender   Vulva:  normal  Vagina: normal vagina  Cervix:  no cervical motion tenderness  Corpus: normal size, contour, position, consistency, mobility, non-tender  Adnexa:  no mass, fullness, tenderness  Rectal Exam: Not performed.           Assessment:     Normal postpartum exam. Pap smear not done at today's visit.    Contraceptive management  Plan:    1. Contraception: OCP (estrogen/progesterone) 2. Triphasil 28 Rx 3. Follow up in: several months or as needed.   Healthy lifestyle practices  reviewed

## 2015-05-13 ENCOUNTER — Ambulatory Visit: Payer: Managed Care, Other (non HMO) | Admitting: Obstetrics

## 2015-07-29 ENCOUNTER — Ambulatory Visit (INDEPENDENT_AMBULATORY_CARE_PROVIDER_SITE_OTHER): Payer: Managed Care, Other (non HMO) | Admitting: Obstetrics

## 2015-07-29 ENCOUNTER — Ambulatory Visit: Payer: Managed Care, Other (non HMO) | Admitting: Obstetrics

## 2015-07-29 ENCOUNTER — Encounter: Payer: Self-pay | Admitting: Obstetrics

## 2015-07-29 VITALS — BP 152/95 | HR 81 | Wt 198.0 lb

## 2015-07-29 DIAGNOSIS — Z01419 Encounter for gynecological examination (general) (routine) without abnormal findings: Secondary | ICD-10-CM | POA: Diagnosis not present

## 2015-07-29 DIAGNOSIS — Z3041 Encounter for surveillance of contraceptive pills: Secondary | ICD-10-CM

## 2015-07-29 DIAGNOSIS — Z3009 Encounter for other general counseling and advice on contraception: Secondary | ICD-10-CM

## 2015-07-29 DIAGNOSIS — I10 Essential (primary) hypertension: Secondary | ICD-10-CM

## 2015-07-29 MED ORDER — NORGESTIM-ETH ESTRAD TRIPHASIC 0.18/0.215/0.25 MG-35 MCG PO TABS: 1.0000 | ORAL_TABLET | Freq: Every day | ORAL | Status: DC

## 2015-07-29 MED ORDER — TRIAMTERENE-HCTZ 50-25 MG PO CAPS: 1.0000 | ORAL_CAPSULE | ORAL | Status: DC

## 2015-07-29 MED ORDER — TRIAMTERENE-HCTZ 37.5-25 MG PO CAPS: 1.0000 | ORAL_CAPSULE | Freq: Every day | ORAL | Status: DC

## 2015-07-29 MED ORDER — CARVEDILOL 6.25 MG PO TABS: 6.2500 mg | ORAL_TABLET | Freq: Two times a day (BID) | ORAL | Status: DC

## 2015-07-29 NOTE — Progress Notes (Signed)
Subjective:        Elaine Martin is a 29 y.o. female here for a routine exam.  Current complaints: none.    Personal health questionnaire:  Is patient Ashkenazi Jewish, have a family history of breast and/or ovarian cancer: no Is there a family history of uterine cancer diagnosed at age < 40, gastrointestinal cancer, urinary tract cancer, family member who is a Personnel officer syndrome-associated carrier: no Is the patient overweight and hypertensive, family history of diabetes, personal history of gestational diabetes, preeclampsia or PCOS: no Is patient over 28, have PCOS,  family history of premature CHD under age 30, diabetes, smoke, have hypertension or peripheral artery disease:  no At any time, has a partner hit, kicked or otherwise hurt or frightened you?: no Over the past 2 weeks, have you felt down, depressed or hopeless?: no Over the past 2 weeks, have you felt little interest or pleasure in doing things?:no   Gynecologic History Patient's last menstrual period was 07/19/2015. Contraception: OCP (estrogen/progesterone) Last Pap: 2015. Results were: normal Last mammogram: n/a. Results were: n/a  Obstetric History OB History  Gravida Para Term Preterm AB SAB TAB Ectopic Multiple Living  0 2    # Outcome Date GA Lbr Len/2nd Weight Sex Delivery Anes PTL Lv  3 Term 03/23/15 [redacted]w[redacted]d  5 lb 14.9 oz (2.69 kg) M CS-LTranv Spinal  Y  2 Term 10/24/06 [redacted]w[redacted]d  6 lb 2 oz (2.778 kg) M CS-LTranv Spinal N Y  1 AB 2004              Past Medical History  Diagnosis Date  . Medical history non-contributory     Past Surgical History  Procedure Laterality Date  . Cesarean section    . Cesarean section N/A 03/22/2015    Procedure: REPEAT CESAREAN SECTION;  Surgeon: Kathreen Cosier, MD;  Location: [facility];  Service: Obstetrics;  Laterality: N/A;     Current outpatient prescriptions:  .  Norgestimate-Ethinyl Estradiol Triphasic 0.18/0.215/0.25 MG-35 MCG tablet, Take 1 tablet by  mouth daily., Disp: 1 Package, Rfl: 11 .  dibucaine (NUPERCAINAL) 1 % OINT, Place 1 application rectally as needed for hemorrhoids. (Patient not taking: Reported on 04/28/2015), Disp: 56.7 g, Rfl: 0 .  Elastic Bandages & Supports (ABDOMINAL BINDER/ELASTIC MED) MISC, 1 application by Does not apply route daily. (Patient not taking: Reported on 04/28/2015), Disp: 1 each, Rfl: 0 .  ibuprofen (ADVIL,MOTRIN) 600 MG tablet, Take 1 tablet (600 mg total) by mouth every 6 (six) hours. (Patient not taking: Reported on 04/28/2015), Disp: 30 tablet, Rfl: 0 No Known Allergies  Social History  Substance Use Topics  . Smoking status: Former Smoker    Types: Cigarettes  . Smokeless tobacco: Never Used  . Alcohol Use: No    Family History  Problem Relation Age of Onset  . Hypertension Mother   . Diabetes Mother       Review of Systems  Constitutional: negative for fatigue and weight loss Respiratory: negative for cough and wheezing Cardiovascular: negative for chest pain, fatigue and palpitations Gastrointestinal: negative for abdominal pain and change in bowel habits Musculoskeletal:negative for myalgias Neurological: negative for gait problems and tremors Behavioral/Psych: negative for abusive relationship, depression Endocrine: negative for temperature intolerance   Genitourinary:negative for abnormal menstrual periods, genital lesions, hot flashes, sexual problems and vaginal discharge Integument/breast: negative for breast lump, breast tenderness, nipple discharge and skin lesion(s)    Objective:  BP 152/95 mmHg  Pulse 81  Wt 198 lb (89.812 kg)  LMP 07/19/2015  Breastfeeding? Yes General:   alert  Skin:   no rash or abnormalities  Lungs:   clear to auscultation bilaterally  Heart:   regular rate and rhythm, S1, S2 normal, no murmur, click, rub or gallop  Breasts:   normal without suspicious masses, skin or nipple changes or axillary nodes  Abdomen:  normal findings: no organomegaly,  soft, non-tender and no hernia  Pelvis:  External genitalia: normal general appearance Urinary system: urethral meatus normal and bladder without fullness, nontender Vaginal: normal without tenderness, induration or masses Cervix: normal appearance Adnexa: normal bimanual exam Uterus: anteverted and non-tender, normal size   Lab Review Urine pregnancy test Labs reviewed yes Radiologic studies reviewed no    Assessment:    Healthy female exam.   Contraceptive OCP surveillance Hypertension.   Plan:   Continue OCP's but must maintain BP control.  Referred to Internal Medicine for BP management and health maintenance. Dyazide and Coreg Rx  Education reviewed: calcium supplements, low fat, low cholesterol diet, safe sex/STD prevention, self breast exams and weight bearing exercise. Contraception: OCP (estrogen/progesterone). Follow up in: 1 year.   No orders of the defined types were placed in this encounter.   No orders of the defined types were placed in this encounter.

## 2015-07-31 LAB — PAP, TP IMAGING W/ HPV RNA, RFLX HPV TYPE 16,18/45: HPV MRNA, HIGH RISK: NOT DETECTED

## 2015-08-03 LAB — SURESWAB, VAGINOSIS/VAGINITIS PLUS
ATOPOBIUM VAGINAE: 5.7 Log (cells/mL)
BV CATEGORY: UNDETERMINED — AB
C. ALBICANS, DNA: NOT DETECTED
C. TRACHOMATIS RNA, TMA: NOT DETECTED
C. glabrata, DNA: NOT DETECTED
C. parapsilosis, DNA: NOT DETECTED
C. tropicalis, DNA: NOT DETECTED
Gardnerella vaginalis: 7.7 Log (cells/mL)
LACTOBACILLUS SPECIES: 7.7 Log (cells/mL)
MEGASPHAERA SPECIES: NOT DETECTED Log (cells/mL)
N. GONORRHOEAE RNA, TMA: NOT DETECTED
T. vaginalis RNA, QL TMA: NOT DETECTED

## 2015-08-04 ENCOUNTER — Other Ambulatory Visit: Payer: Self-pay | Admitting: Obstetrics

## 2015-08-04 DIAGNOSIS — N760 Acute vaginitis: Principal | ICD-10-CM

## 2015-08-04 DIAGNOSIS — B9689 Other specified bacterial agents as the cause of diseases classified elsewhere: Secondary | ICD-10-CM

## 2015-08-04 MED ORDER — METRONIDAZOLE 500 MG PO TABS: 500.0000 mg | ORAL_TABLET | Freq: Two times a day (BID) | ORAL | Status: DC

## 2015-08-17 ENCOUNTER — Ambulatory Visit (INDEPENDENT_AMBULATORY_CARE_PROVIDER_SITE_OTHER): Payer: Managed Care, Other (non HMO) | Admitting: Internal Medicine

## 2015-08-17 ENCOUNTER — Other Ambulatory Visit (INDEPENDENT_AMBULATORY_CARE_PROVIDER_SITE_OTHER): Payer: Managed Care, Other (non HMO)

## 2015-08-17 ENCOUNTER — Encounter: Payer: Self-pay | Admitting: Internal Medicine

## 2015-08-17 VITALS — BP 134/86 | HR 91 | Temp 98.8°F | Resp 16 | Wt 192.0 lb

## 2015-08-17 DIAGNOSIS — I10 Essential (primary) hypertension: Secondary | ICD-10-CM

## 2015-08-17 LAB — COMPREHENSIVE METABOLIC PANEL
ALBUMIN: 4 g/dL (ref 3.5–5.2)
ALT: 11 U/L (ref 0–35)
AST: 13 U/L (ref 0–37)
Alkaline Phosphatase: 45 U/L (ref 39–117)
BUN: 25 mg/dL — ABNORMAL HIGH (ref 6–23)
CALCIUM: 9.2 mg/dL (ref 8.4–10.5)
CO2: 28 mEq/L (ref 19–32)
CREATININE: 1.15 mg/dL (ref 0.40–1.20)
Chloride: 102 mEq/L (ref 96–112)
GFR: 71.15 mL/min (ref >60.00)
Glucose, Bld: 88 mg/dL (ref 70–99)
POTASSIUM: 4.2 meq/L (ref 3.5–5.1)
Sodium: 139 mEq/L (ref 135–145)
Total Bilirubin: 0.3 mg/dL (ref 0.2–1.2)
Total Protein: 7.3 g/dL (ref 6.0–8.3)

## 2015-08-17 NOTE — Patient Instructions (Addendum)
  We have reviewed your prior records including labs and tests today.  Test(s) ordered today. Your results will be released to MyChart (or called to you) after review, usually within 72hours after test completion. If any changes need to be made, you will be notified at that same time.  All other Health Maintenance issues reviewed.   All recommended immunizations and age-appropriate screenings are up-to-date.  No immunizations administered today.   Medications reviewed and updated.  No changes recommended at this time.  Your prescription(s) have been submitted to your pharmacy. Please take as directed and contact our office if you believe you are having problem(s) with the medication(s).   Please schedule followup in 6 months      

## 2015-08-17 NOTE — Assessment & Plan Note (Signed)
BP controlled here today Continue current medication at current dose Check cmp Low sodium diet, regular exercise, work on weight loss Follow up in 6 months

## 2015-08-17 NOTE — Progress Notes (Signed)
Subjective:    Patient ID: Elaine Martin, female    DOB: Jun 09, 1986, 29 y.o.   MRN: [id number]  HPI She is here to establish with a new pcp. She was recently diagnosed with hypertension.    Her first pregnancy she did great and had no problems.  Her second pregnancy her blood pressure varied throughout the pregnancy.  She was not on medication during the pregnancy for her BP.  When she delivered she had very high blood pressure.  Initially her BP was ok postpartum, but it became elevated.  Her gyn started her on medication last month.  She has a family history of hypertension on both sides of her family.  She is walking for exercise and has lost weight since her pregnancy.    Medications and allergies reviewed with patient and updated if appropriate.  Patient Active Problem List   Diagnosis Date Noted  . S/P cesarean section 03/23/2015  . Threatened premature labor, antepartum 03/22/2015  . Encounter for fetal anatomic survey   . [redacted] weeks gestation of pregnancy   . Obesity affecting pregnancy   . BV (bacterial vaginosis) 05/08/2014    Past Medical History  Diagnosis Date  . Medical history non-contributory   . Allergy     Past Surgical History  Procedure Laterality Date  . Cesarean section    . Cesarean section N/A 03/22/2015    Procedure: REPEAT CESAREAN SECTION;  Surgeon: Kathreen Cosier, MD;  Location: [facility];  Service: Obstetrics;  Laterality: N/A;    Social History   Social History  . Marital Status: Single    Spouse Name: N/A  . Number of Children: N/A  . Years of Education: N/A   Social History Main Topics  . Smoking status: Former Smoker    Types: Cigarettes  . Smokeless tobacco: Never Used  . Alcohol Use: No  . Drug Use: No  . Sexual Activity:    Partners: Male    Birth Control/ Protection: None   Other Topics Concern  . None   Social History Narrative    Review of Systems  Constitutional: Negative for fever and chills.  Respiratory:  Negative for cough, shortness of breath and wheezing.   Cardiovascular: Negative for chest pain, palpitations and leg swelling.  Gastrointestinal: Positive for constipation. Negative for nausea, abdominal pain, diarrhea and blood in stool.       No GERD  Endocrine: Negative for polydipsia and polyphagia.  Musculoskeletal: Negative for back pain and arthralgias.  Neurological: Negative for dizziness, light-headedness and headaches.  Psychiatric/Behavioral: Negative for dysphoric mood. The patient is not nervous/anxious.        Objective:   Filed Vitals:   08/17/15 1401  BP: 134/86  Pulse: 91  Temp: 98.8 F (37.1 C)  Resp: 16   Filed Weights   08/17/15 1401  Weight: 192 lb (87.091 kg)   Body mass index is 32.94 kg/(m^2).   Physical Exam  Constitutional: She is oriented to person, place, and time. She appears well-developed and well-nourished. No distress.  HENT:  Head: Normocephalic and atraumatic.  Right Ear: External ear normal.  Left Ear: External ear normal.  Eyes: Conjunctivae are normal.  Neck: Neck supple. No tracheal deviation present. No thyromegaly present.  No carotid bruit  Cardiovascular: Normal rate, regular rhythm, normal heart sounds and intact distal pulses.   No murmur heard. Pulmonary/Chest: Effort normal and breath sounds normal. No respiratory distress. She has no wheezes.  Abdominal: She exhibits no distension. There is  no tenderness.  Musculoskeletal: She exhibits no edema.  Lymphadenopathy:    She has no cervical adenopathy.  Neurological: She is oriented to person, place, and time.  Psychiatric: She has a normal mood and affect. Her behavior is normal.        Assessment & Plan:   See Problem List.   Follow up in 6 months

## 2015-08-17 NOTE — Progress Notes (Signed)
Pre visit review using our clinic review tool, if applicable. No additional management support is needed unless otherwise documented below in the visit note. 

## 2016-02-17 ENCOUNTER — Encounter: Payer: Managed Care, Other (non HMO) | Admitting: Internal Medicine

## 2016-02-17 DIAGNOSIS — Z0289 Encounter for other administrative examinations: Secondary | ICD-10-CM

## 2016-02-17 NOTE — Progress Notes (Signed)
    Subjective:    Patient ID: Elaine Martin, female    DOB: 12/31/1985, 30 y.o.   MRN: [id number]  HPI No show  Patient Active Problem List   Diagnosis Date Noted  . Essential hypertension, benign 08/17/2015  . S/P cesarean section 03/23/2015  . Threatened premature labor, antepartum 03/22/2015  . Encounter for fetal anatomic survey   . [redacted] weeks gestation of pregnancy   . Obesity affecting pregnancy   . BV (bacterial vaginosis) 05/08/2014    Current Outpatient Prescriptions on File Prior to Visit  Medication Sig Dispense Refill  . metroNIDAZOLE (FLAGYL) 500 MG tablet Take 1 tablet (500 mg total) by mouth 2 (two) times daily. 14 tablet 2  . Norgestimate-Ethinyl Estradiol Triphasic 0.18/0.215/0.25 MG-35 MCG tablet Take 1 tablet by mouth daily. 1 Package 11  . triamterene-hydrochlorothiazide (DYAZIDE) 37.5-25 MG capsule Take 1 each (1 capsule total) by mouth daily. 30 capsule 11   No current facility-administered medications on file prior to visit.    Past Medical History  Diagnosis Date  . Medical history non-contributory   . Allergy     Past Surgical History  Procedure Laterality Date  . Cesarean section    . Cesarean section N/A 03/22/2015    Procedure: REPEAT CESAREAN SECTION;  Surgeon: Kathreen CosierBernard A Marshall, MD;  Location: [facility];  Service: Obstetrics;  Laterality: N/A;    Social History   Social History  . Marital Status: Single    Spouse Name: N/A  . Number of Children: N/A  . Years of Education: N/A   Social History Main Topics  . Smoking status: Former Smoker    Types: Cigarettes  . Smokeless tobacco: Never Used  . Alcohol Use: No  . Drug Use: No  . Sexual Activity:    Partners: Male    Birth Control/ Protection: None   Other Topics Concern  . Not on file   Social History Narrative    Family History  Problem Relation Age of Onset  . Hypertension Mother   . Diabetes Mother   . Hypertension Maternal Grandmother     Review of Systems       Objective:  There were no vitals filed for this visit. There were no vitals filed for this visit. There is no weight on file to calculate BMI.   Physical Exam         Assessment & Plan:    This encounter was created in error - please disregard.

## 2016-03-27 ENCOUNTER — Other Ambulatory Visit: Payer: Self-pay | Admitting: Obstetrics

## 2016-04-19 ENCOUNTER — Other Ambulatory Visit: Payer: Self-pay | Admitting: Obstetrics

## 2016-05-21 ENCOUNTER — Other Ambulatory Visit: Payer: Self-pay | Admitting: Obstetrics

## 2016-06-15 ENCOUNTER — Other Ambulatory Visit: Payer: Self-pay | Admitting: Obstetrics

## 2016-07-15 ENCOUNTER — Other Ambulatory Visit: Payer: Self-pay | Admitting: Obstetrics

## 2016-08-10 ENCOUNTER — Other Ambulatory Visit: Payer: Self-pay | Admitting: Obstetrics

## 2016-09-02 ENCOUNTER — Encounter: Payer: Self-pay | Admitting: Obstetrics

## 2016-09-02 ENCOUNTER — Ambulatory Visit (INDEPENDENT_AMBULATORY_CARE_PROVIDER_SITE_OTHER): Payer: Managed Care, Other (non HMO) | Admitting: Obstetrics

## 2016-09-02 VITALS — BP 133/85 | HR 73 | Temp 98.0°F | Wt 214.1 lb

## 2016-09-02 DIAGNOSIS — Z124 Encounter for screening for malignant neoplasm of cervix: Secondary | ICD-10-CM

## 2016-09-02 DIAGNOSIS — Z3041 Encounter for surveillance of contraceptive pills: Secondary | ICD-10-CM | POA: Diagnosis not present

## 2016-09-02 DIAGNOSIS — Z01419 Encounter for gynecological examination (general) (routine) without abnormal findings: Secondary | ICD-10-CM

## 2016-09-02 DIAGNOSIS — Z0000 Encounter for general adult medical examination without abnormal findings: Secondary | ICD-10-CM

## 2016-09-02 MED ORDER — NORGESTIM-ETH ESTRAD TRIPHASIC 0.18/0.215/0.25 MG-35 MCG PO TABS: 1.0000 | ORAL_TABLET | Freq: Every day | ORAL | 11 refills | Status: DC

## 2016-09-02 NOTE — Addendum Note (Signed)
Addended by: Francene FindersJAMES, QUINETTA C on: 09/02/2016 10:59 AM   Modules accepted: Orders

## 2016-09-02 NOTE — Progress Notes (Signed)
Subjective:        Elaine Martin is a 30 y.o. female here for a routine exam.  Current complaints: None.    Personal health questionnaire:  Is patient Ashkenazi Jewish, have a family history of breast and/or ovarian cancer: no Is there a family history of uterine cancer diagnosed at age < 5850, gastrointestinal cancer, urinary tract cancer, family member who is a Personnel officerLynch syndrome-associated carrier: no Is the patient overweight and hypertensive, family history of diabetes, personal history of gestational diabetes, preeclampsia or PCOS: no Is patient over 3355, have PCOS,  family history of premature CHD under age 31, diabetes, smoke, have hypertension or peripheral artery disease:  no At any time, has a partner hit, kicked or otherwise hurt or frightened you?: no Over the past 2 weeks, have you felt down, depressed or hopeless?: no Over the past 2 weeks, have you felt little interest or pleasure in doing things?:no   Gynecologic History No LMP recorded. Contraception: OCP (estrogen/progesterone) Last Pap: 2016. Results were: normal Last mammogram: n/a. Results were: n/a  Obstetric History OB History  Gravida Para Term Preterm AB Living  3 2 2   1 2   SAB TAB Ectopic Multiple Live Births        0 2    # Outcome Date GA Lbr Len/2nd Weight Sex Delivery Anes PTL Lv  3 Term 03/23/15 [redacted]w[redacted]d  5 lb 14.9 oz (2.69 kg) M CS-LTranv Spinal  LIV  2 Term 10/24/06 [id number]  6 lb 2 oz (2.778 kg) M CS-LTranv Spinal N LIV  1 AB 2004              Past Medical History:  Diagnosis Date  . Allergy   . Medical history non-contributory     Past Surgical History:  Procedure Laterality Date  . CESAREAN SECTION    . CESAREAN SECTION N/A 03/22/2015   Procedure: REPEAT CESAREAN SECTION;  Surgeon: Kathreen CosierBernard A Marshall, MD;  Location: [facility];  Service: Obstetrics;  Laterality: N/A;     Current Outpatient Prescriptions:  .  Norgestimate-Ethinyl Estradiol Triphasic 0.18/0.215/0.25 MG-35 MCG tablet, Take 1  tablet by mouth daily., Disp: 1 Package, Rfl: 11 .  metroNIDAZOLE (FLAGYL) 500 MG tablet, Take 1 tablet (500 mg total) by mouth 2 (two) times daily. (Patient not taking: Reported on 09/02/2016), Disp: 14 tablet, Rfl: 2 .  TRI-SPRINTEC 0.18/0.215/0.25 MG-35 MCG tablet, TAKE ONE TABLET BY MOUTH ONCE DAILY (Patient not taking: Reported on 09/02/2016), Disp: 28 tablet, Rfl: 0 .  triamterene-hydrochlorothiazide (DYAZIDE) 37.5-25 MG capsule, Take 1 each (1 capsule total) by mouth daily. (Patient not taking: Reported on 09/02/2016), Disp: 30 capsule, Rfl: 11 No Known Allergies  Social History  Substance Use Topics  . Smoking status: Former Smoker    Types: Cigarettes  . Smokeless tobacco: Never Used  . Alcohol use No    Family History  Problem Relation Age of Onset  . Hypertension Mother   . Diabetes Mother   . Hypertension Maternal Grandmother       Review of Systems  Constitutional: negative for fatigue and weight loss Respiratory: negative for cough and wheezing Cardiovascular: negative for chest pain, fatigue and palpitations Gastrointestinal: negative for abdominal pain and change in bowel habits Musculoskeletal:negative for myalgias Neurological: negative for gait problems and tremors Behavioral/Psych: negative for abusive relationship, depression Endocrine: negative for temperature intolerance    Genitourinary:negative for abnormal menstrual periods, genital lesions, hot flashes, sexual problems and vaginal discharge Integument/breast: negative for breast lump, breast tenderness,  nipple discharge and skin lesion(s)    Objective:       BP 133/85   Pulse 73   Temp 98 F (36.7 C) (Oral)   Wt 214 lb 1.6 oz (97.1 kg)   BMI 36.75 kg/m  General:   alert  Skin:   no rash or abnormalities  Lungs:   clear to auscultation bilaterally  Heart:   regular rate and rhythm, S1, S2 normal, no murmur, click, rub or gallop  Breasts:   normal without suspicious masses, skin or nipple  changes or axillary nodes  Abdomen:  normal findings: no organomegaly, soft, non-tender and no hernia  Pelvis:  External genitalia: normal general appearance Urinary system: urethral meatus normal and bladder without fullness, nontender Vaginal: normal without tenderness, induration or masses Cervix: normal appearance Adnexa: normal bimanual exam Uterus: anteverted and non-tender, normal size   Lab Review Urine pregnancy test Labs reviewed yes Radiologic studies reviewed no  50% of 20 min visit spent on counseling and coordination of care.    Assessment:    Healthy female exam.    Contraceptive counseling and advice.  Pleased with TriSprintec    Plan:    TriSprintec Rx  Education reviewed: calcium supplements, low fat, low cholesterol diet, safe sex/STD prevention, self breast exams and weight bearing exercise. Contraception: OCP (estrogen/progesterone). Follow up in: 1 year.   No orders of the defined types were placed in this encounter.  No orders of the defined types were placed in this encounter.     Patient ID: Elaine Martin, female   DOB: 07/04/1986, 30 y.o.   MRN: [id number]

## 2016-09-06 LAB — CYTOLOGY - PAP
Diagnosis: NEGATIVE
HPV: NOT DETECTED

## 2016-09-08 ENCOUNTER — Other Ambulatory Visit: Payer: Self-pay | Admitting: Obstetrics

## 2016-09-08 DIAGNOSIS — B3731 Acute candidiasis of vulva and vagina: Secondary | ICD-10-CM

## 2016-09-08 DIAGNOSIS — B373 Candidiasis of vulva and vagina: Secondary | ICD-10-CM

## 2016-09-08 LAB — NUSWAB VG+, CANDIDA 6SP
CANDIDA ALBICANS, NAA: POSITIVE — AB
CANDIDA GLABRATA, NAA: NEGATIVE
CANDIDA TROPICALIS, NAA: NEGATIVE
Candida krusei, NAA: NEGATIVE
Candida lusitaniae, NAA: NEGATIVE
Candida parapsilosis, NAA: NEGATIVE
Chlamydia trachomatis, NAA: NEGATIVE
Neisseria gonorrhoeae, NAA: NEGATIVE
Trich vag by NAA: NEGATIVE

## 2016-09-08 MED ORDER — FLUCONAZOLE 150 MG PO TABS: 150.0000 mg | ORAL_TABLET | Freq: Once | ORAL | 2 refills | Status: DC

## 2016-09-13 ENCOUNTER — Other Ambulatory Visit: Payer: Self-pay | Admitting: *Deleted

## 2016-09-13 DIAGNOSIS — B3731 Acute candidiasis of vulva and vagina: Secondary | ICD-10-CM

## 2016-09-13 DIAGNOSIS — B373 Candidiasis of vulva and vagina: Secondary | ICD-10-CM

## 2016-09-13 MED ORDER — FLUCONAZOLE 150 MG PO TABS
150.0000 mg | ORAL_TABLET | Freq: Once | ORAL | 2 refills | Status: AC
Start: 2016-09-13 — End: 2016-09-13

## 2017-01-27 IMAGING — US US OB DETAIL+14 WK
1 series · 12 of 28 positions shown · non-contrast
Comparison: none

[Series 1: us ob detail +14 wk · 68 acquisitions, 12 frames shown]
[im 3/68]
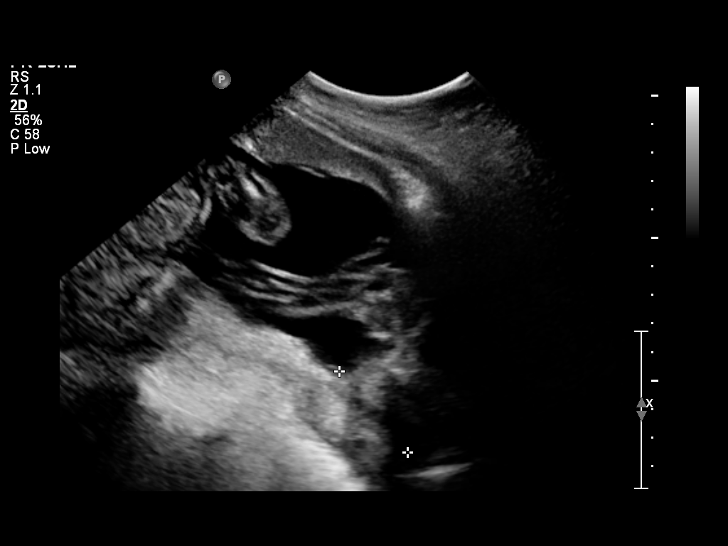
[im 8/68]
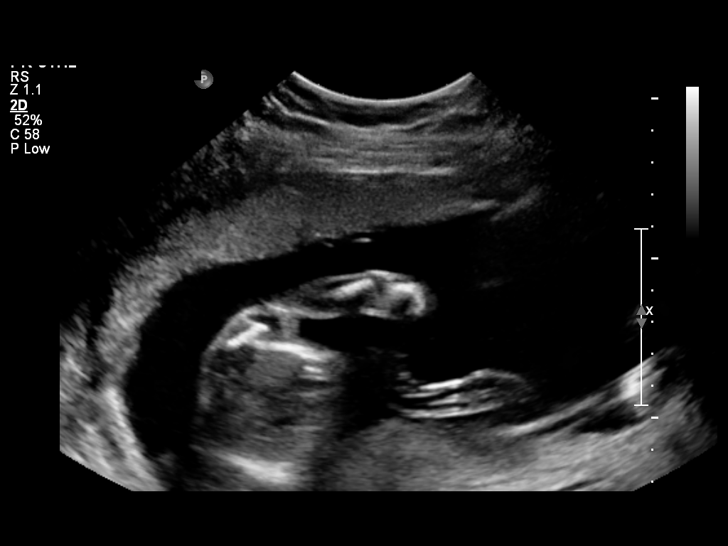
[im 13/68]
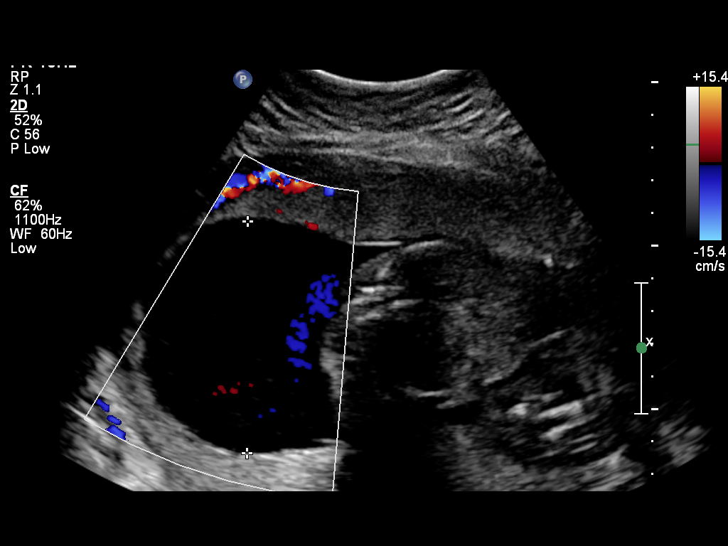
[im 20/68]
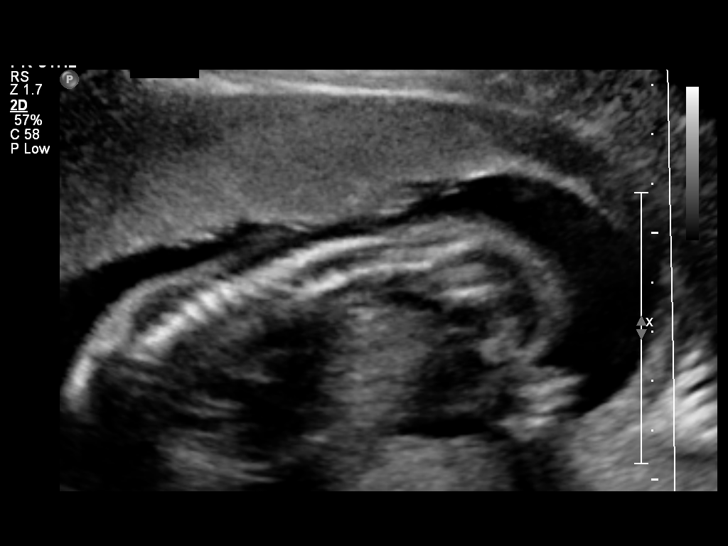
[im 25/68]
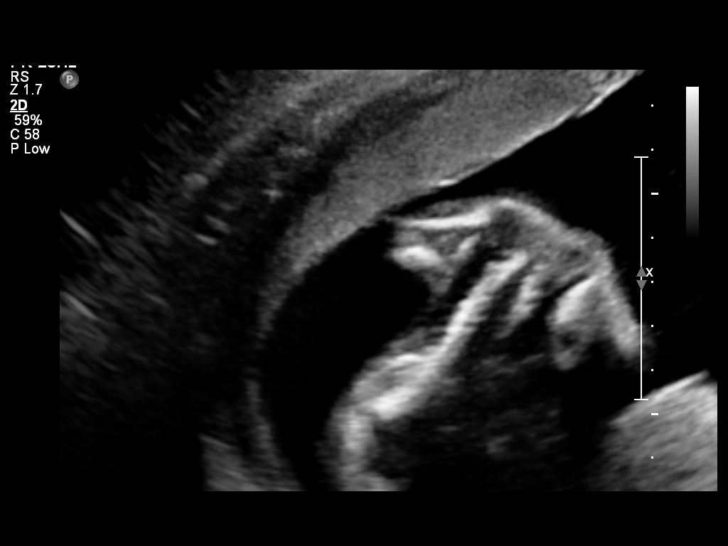
[im 30/68]
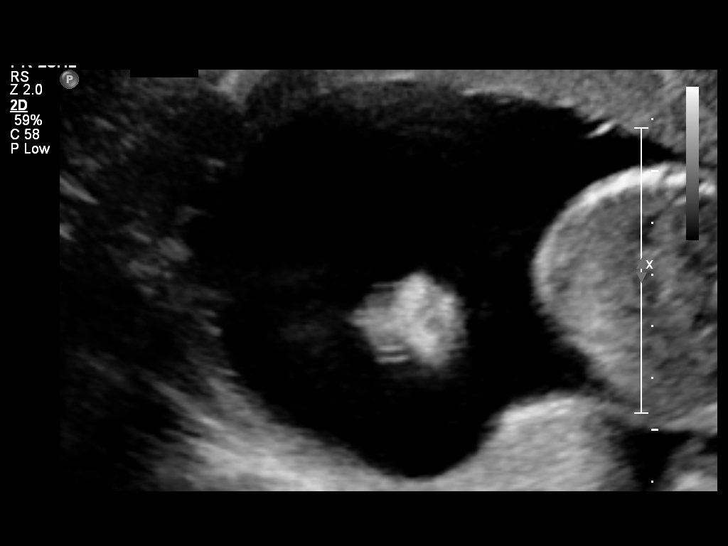
[im 38/68]
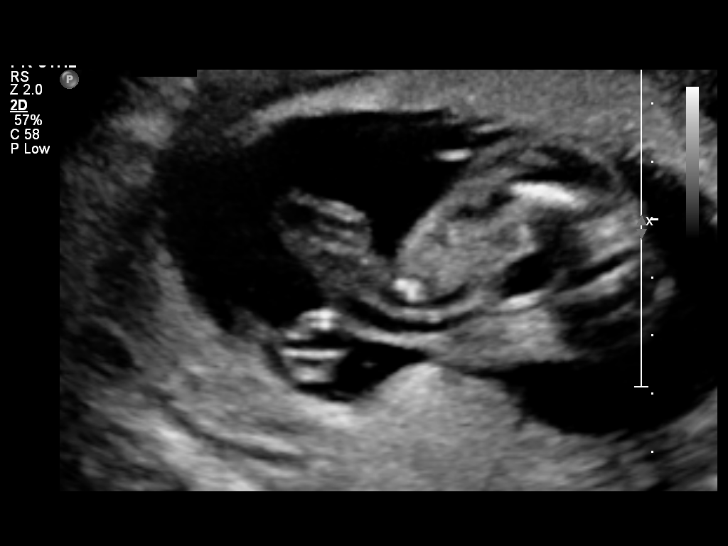
[im 43/68]
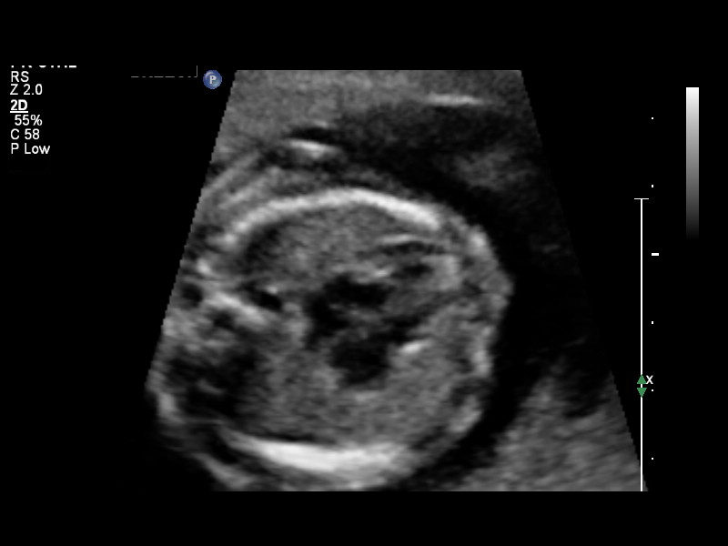
[im 48/68]
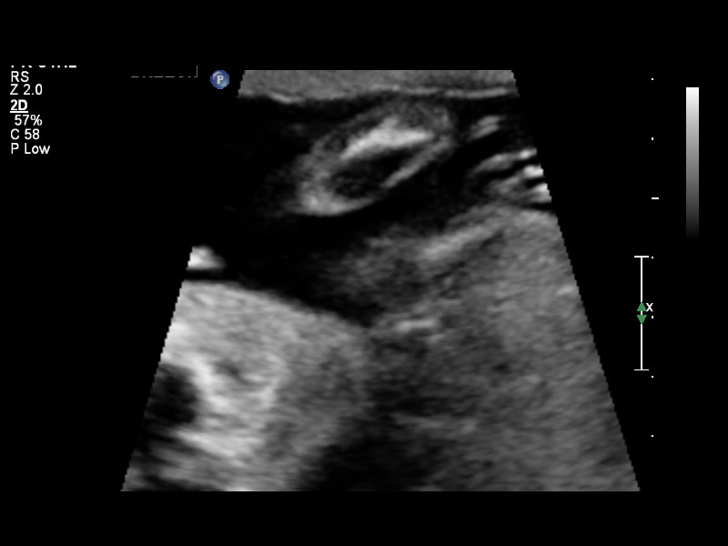
[im 55/68]
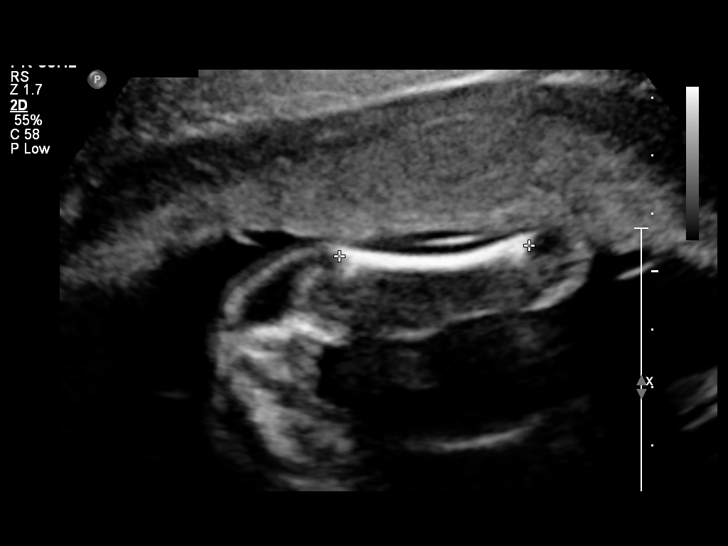
[im 60/68]
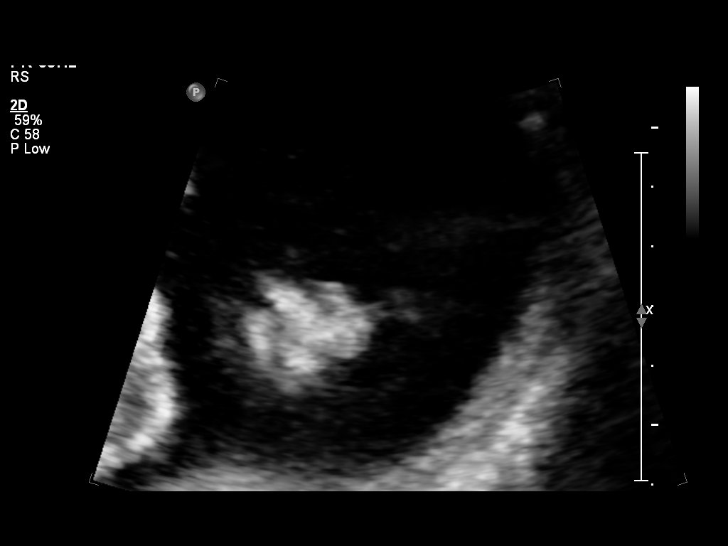
[im 65/68]
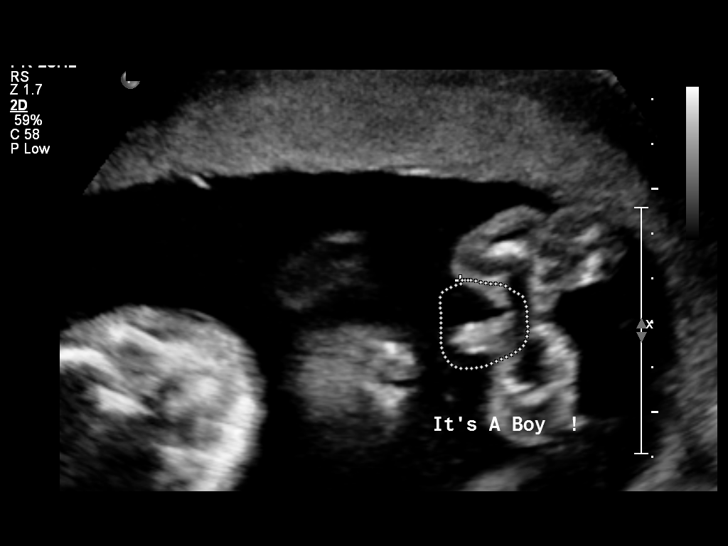

[12 of 28 positions shown; findings below may reference images not displayed]

OBSTETRICS REPORT
                      (Signed Final 11/24/2014 [DATE])

Service(s) Provided

 US OB DETAIL + 14 WK                                  76811.0
Indications

 20 weeks gestation of pregnancy
 Maternal morbid obesity
 Detailed fetal anatomic survey                        Z36
Fetal Evaluation

 Num Of Fetuses:    1
 Fetal Heart Rate:  147                          bpm
 Cardiac Activity:  Observed
 Presentation:      Breech
 Placenta:          Anterior, above cervical os
 P. Cord            Visualized
 Insertion:

 Amniotic Fluid
 AFI FV:      Subjectively within normal limits
                                             Larg Pckt:     7.0  cm
Biometry

 BPD:     49.5  mm     G. Age:  21w 0d                CI:        67.06   70 - 86
                                                      FL/HC:      17.6   15.9 -

 HC:     193.6  mm     G. Age:  21w 4d       78  %    HC/AC:      1.20   1.06 -

 AC:     160.7  mm     G. Age:  21w 1d       59  %    FL/BPD:
 FL:        34  mm     G. Age:  20w 5d       41  %    FL/AC:      21.2   20 - 24
 HUM:     33.5  mm     G. Age:  21w 2d       65  %
 CER:     21.2  mm     G. Age:  20w 1d       39  %

 Est. FW:     393  gm    0 lb 14 oz      51  %
Gestational Age

 LMP:           20w 5d        Date:  07/02/14                 EDD:   04/08/15
 U/S Today:     21w 1d                                        EDD:   04/05/15
 Best:          20w 5d     Det. By:  LMP  (07/02/14)          EDD:   04/08/15
Anatomy

 Cranium:          Appears normal         Aortic Arch:      Not well visualized
 Fetal Cavum:      Appears normal         Ductal Arch:      Not well visualized
 Ventricles:       Appears normal         Diaphragm:        Appears normal
 Choroid Plexus:   Appears normal         Stomach:          Appears normal, left
                                                            sided
 Cerebellum:       Appears normal         Abdomen:          Appears normal
 Posterior Fossa:  Appears normal         Abdominal Wall:   Appears nml (cord
                                                            insert, abd wall)
 Nuchal Fold:      Not applicable (>20    Cord Vessels:     Appears normal (3
                   wks GA)                                  vessel cord)
 Face:             Appears normal         Kidneys:          Appear normal
                   (orbits and profile)
 Lips:             Appears normal         Bladder:          Appears normal
 Heart:            Appears normal         Spine:            Appears normal
                   (4CH, axis, and
                   situs)
 RVOT:             Appears normal         Lower             Appears normal
                                          Extremities:
 LVOT:             Not well visualized    Upper             Appears normal
                                          Extremities:

 Other:  Male gender. Technically difficult due to maternal habitus and fetal
         position.
Targeted Anatomy

 Fetal Central Nervous System
 Cisterna Magna:
Cervix Uterus Adnexa

 Cervical Length:    3.7      cm

 Cervix:       Normal appearance by transabdominal scan.
 Left Ovary:    Within normal limits.
 Right Ovary:   Size(cm) L: 2.34 x W: 2.03 x H: 1.41  Volume(cc):
Impression

 SIUP at 77w5d
 EFW 51st%
 No dysmorphic features, limitations as above
 No previa
Recommendations

 Recommend follow up attempt to complete fetal survey and
 plot growth in 6 weeks.

 questions or concerns.

## 2017-08-11 ENCOUNTER — Other Ambulatory Visit: Payer: Self-pay | Admitting: Obstetrics

## 2017-09-06 ENCOUNTER — Other Ambulatory Visit (HOSPITAL_COMMUNITY)
Admission: RE | Admit: 2017-09-06 | Discharge: 2017-09-06 | Disposition: A | Discharge status: Home / Self Care | Payer: 59 | Source: Ambulatory Visit | Attending: Obstetrics | Admitting: Obstetrics

## 2017-09-06 ENCOUNTER — Encounter: Payer: Self-pay | Admitting: Obstetrics

## 2017-09-06 ENCOUNTER — Ambulatory Visit (INDEPENDENT_AMBULATORY_CARE_PROVIDER_SITE_OTHER): Payer: 59 | Admitting: Obstetrics

## 2017-09-06 VITALS — BP 134/87 | HR 76 | Ht 64.0 in | Wt 216.0 lb

## 2017-09-06 DIAGNOSIS — B9689 Other specified bacterial agents as the cause of diseases classified elsewhere: Secondary | ICD-10-CM

## 2017-09-06 DIAGNOSIS — Z01419 Encounter for gynecological examination (general) (routine) without abnormal findings: Secondary | ICD-10-CM | POA: Insufficient documentation

## 2017-09-06 DIAGNOSIS — N898 Other specified noninflammatory disorders of vagina: Secondary | ICD-10-CM | POA: Diagnosis present

## 2017-09-06 DIAGNOSIS — Z113 Encounter for screening for infections with a predominantly sexual mode of transmission: Secondary | ICD-10-CM | POA: Diagnosis not present

## 2017-09-06 DIAGNOSIS — Z1151 Encounter for screening for human papillomavirus (HPV): Secondary | ICD-10-CM

## 2017-09-06 DIAGNOSIS — R87810 Cervical high risk human papillomavirus (HPV) DNA test positive: Secondary | ICD-10-CM | POA: Insufficient documentation

## 2017-09-06 DIAGNOSIS — Z124 Encounter for screening for malignant neoplasm of cervix: Secondary | ICD-10-CM

## 2017-09-06 DIAGNOSIS — N760 Acute vaginitis: Secondary | ICD-10-CM

## 2017-09-06 MED ORDER — METRONIDAZOLE 500 MG PO TABS: 500.0000 mg | ORAL_TABLET | Freq: Two times a day (BID) | ORAL | 2 refills | Status: DC

## 2017-09-06 NOTE — Progress Notes (Signed)
Subjective:        Elaine Martin is a 31 y.o. female here for a routine exam.  Current complaints: Malodorous vaginal discharge.    Personal health questionnaire:  Is patient Ashkenazi Jewish, have a family history of breast and/or ovarian cancer: no Is there a family history of uterine cancer diagnosed at age < 6250, gastrointestinal cancer, urinary tract cancer, family member who is a Personnel officerLynch syndrome-associated carrier: no Is the patient overweight and hypertensive, family history of diabetes, personal history of gestational diabetes, preeclampsia or PCOS: no Is patient over 2955, have PCOS,  family history of premature CHD under age 32, diabetes, smoke, have hypertension or peripheral artery disease:  no At any time, has a partner hit, kicked or otherwise hurt or frightened you?: no Over the past 2 weeks, have you felt down, depressed or hopeless?: no Over the past 2 weeks, have you felt little interest or pleasure in doing things?:no   Gynecologic History Patient's last menstrual period was 09/06/2017. Contraception: OCP (estrogen/progesterone) Last Pap: 2017. Results were: normal Last mammogram: n/a. Results were: n/a  Obstetric History OB History  Gravida Para Term Preterm AB Living  3 2 2   1 2   SAB TAB Ectopic Multiple Live Births        0 2    # Outcome Date GA Lbr Len/2nd Weight Sex Delivery Anes PTL Lv  3 Term 03/23/15 6670w5d  5 lb 14.9 oz (2.69 kg) M CS-LTranv Spinal  LIV  2 Term 10/24/06 6152w0d  6 lb 2 oz (2.778 kg) M CS-LTranv Spinal N LIV  1 AB 2004              Past Medical History:  Diagnosis Date  . Allergy   . Medical history non-contributory     Past Surgical History:  Procedure Laterality Date  . CESAREAN SECTION       Current Outpatient Medications:  .  TRI-SPRINTEC 0.18/0.215/0.25 MG-35 MCG tablet, TAKE ONE TABLET BY MOUTH ONCE DAILY, Disp: 28 tablet, Rfl: 11 .  metroNIDAZOLE (FLAGYL) 500 MG tablet, Take 1 tablet (500 mg total) 2 (two) times daily  by mouth., Disp: 14 tablet, Rfl: 2 No Known Allergies  Social History   Tobacco Use  . Smoking status: Former Smoker    Types: Cigarettes  . Smokeless tobacco: Never Used  Substance Use Topics  . Alcohol use: No    Family History  Problem Relation Age of Onset  . Hypertension Mother   . Diabetes Mother   . Hypertension Maternal Grandmother       Review of Systems  Constitutional: negative for fatigue and weight loss Respiratory: negative for cough and wheezing Cardiovascular: negative for chest pain, fatigue and palpitations Gastrointestinal: negative for abdominal pain and change in bowel habits Musculoskeletal:negative for myalgias Neurological: negative for gait problems and tremors Behavioral/Psych: negative for abusive relationship, depression Endocrine: negative for temperature intolerance    Genitourinary:negative for abnormal menstrual periods, genital lesions, hot flashes, sexual problems and vaginal discharge Integument/breast: negative for breast lump, breast tenderness, nipple discharge and skin lesion(s)    Objective:       BP 134/87   Pulse 76   Ht 5\' 4"  (1.626 m)   Wt 216 lb (98 kg)   LMP 09/06/2017   BMI 37.08 kg/m  General:   alert  Skin:   no rash or abnormalities  Lungs:   clear to auscultation bilaterally  Heart:   regular rate and rhythm, S1, S2 normal, no murmur,  click, rub or gallop  Breasts:   normal without suspicious masses, skin or nipple changes or axillary nodes  Abdomen:  normal findings: no organomegaly, soft, non-tender and no hernia  Pelvis:  External genitalia: normal general appearance Urinary system: urethral meatus normal and bladder without fullness, nontender Vaginal: normal without tenderness, induration or masses Cervix: normal appearance Adnexa: normal bimanual exam Uterus: anteverted and non-tender, normal size   Lab Review Urine pregnancy test Labs reviewed yes Radiologic studies reviewed no  50% of 20 min visit  spent on counseling and coordination of care.    Assessment:     1. Encounter for gynecological examination with Papanicolaou smear of cervix Rx: - Cytology - PAP  2. Vaginal discharge Rx: - Cervicovaginal ancillary only  3. BV (bacterial vaginosis) Rx: - metroNIDAZOLE (FLAGYL) 500 MG tablet; Take 1 tablet (500 mg total) 2 (two) times daily by mouth.  Dispense: 14 tablet; Refill: 2   Plan:    Education reviewed: calcium supplements, depression evaluation, low fat, low cholesterol diet, safe sex/STD prevention, self breast exams and weight bearing exercise. Contraception: OCP (estrogen/progesterone). Follow up in: 1 year.   Meds ordered this encounter  Medications  . metroNIDAZOLE (FLAGYL) 500 MG tablet    Sig: Take 1 tablet (500 mg total) 2 (two) times daily by mouth.    Dispense:  14 tablet    Refill:  2   No orders of the defined types were placed in this encounter.

## 2017-09-07 ENCOUNTER — Other Ambulatory Visit: Payer: Self-pay | Admitting: Obstetrics

## 2017-09-07 LAB — CERVICOVAGINAL ANCILLARY ONLY
Bacterial vaginitis: POSITIVE — AB
Candida vaginitis: NEGATIVE
Chlamydia: NEGATIVE
NEISSERIA GONORRHEA: NEGATIVE
Trichomonas: NEGATIVE

## 2017-09-08 LAB — CYTOLOGY - PAP
Diagnosis: NEGATIVE
HPV (WINDOPATH): DETECTED — AB
HPV 16/18/45 genotyping: NEGATIVE

## 2018-07-14 ENCOUNTER — Other Ambulatory Visit: Payer: Self-pay | Admitting: Obstetrics

## 2018-09-03 ENCOUNTER — Ambulatory Visit (INDEPENDENT_AMBULATORY_CARE_PROVIDER_SITE_OTHER): Payer: 59 | Admitting: Obstetrics

## 2018-09-03 ENCOUNTER — Other Ambulatory Visit (HOSPITAL_COMMUNITY)
Admission: RE | Admit: 2018-09-03 | Discharge: 2018-09-03 | Disposition: A | Discharge status: Home / Self Care | Payer: 59 | Source: Ambulatory Visit | Attending: Obstetrics | Admitting: Obstetrics

## 2018-09-03 ENCOUNTER — Encounter: Payer: Self-pay | Admitting: Obstetrics

## 2018-09-03 VITALS — BP 151/92 | HR 76 | Ht 64.0 in | Wt 227.6 lb

## 2018-09-03 DIAGNOSIS — Z01419 Encounter for gynecological examination (general) (routine) without abnormal findings: Secondary | ICD-10-CM | POA: Insufficient documentation

## 2018-09-03 DIAGNOSIS — N898 Other specified noninflammatory disorders of vagina: Secondary | ICD-10-CM

## 2018-09-03 DIAGNOSIS — Z3041 Encounter for surveillance of contraceptive pills: Secondary | ICD-10-CM

## 2018-09-03 MED ORDER — TINIDAZOLE 500 MG PO TABS: 1000.0000 mg | ORAL_TABLET | Freq: Every day | ORAL | 5 refills | Status: DC

## 2018-09-03 MED ORDER — NORGESTIM-ETH ESTRAD TRIPHASIC 0.18/0.215/0.25 MG-35 MCG PO TABS: 1.0000 | ORAL_TABLET | Freq: Every day | ORAL | 11 refills | Status: DC

## 2018-09-03 NOTE — Progress Notes (Addendum)
Subjective:        Elaine Martin is a 32 y.o. female here for a routine exam.  Current complaints:  Malodorous vaginal discharge.  Personal health questionnaire:  Is patient Ashkenazi Jewish, have a family history of breast and/or ovarian cancer: no Is there a family history of uterine cancer diagnosed at age < 67, gastrointestinal cancer, urinary tract cancer, family member who is a Personnel officer syndrome-associated carrier: no Is the patient overweight and hypertensive, family history of diabetes, personal history of gestational diabetes, preeclampsia or PCOS: no Is patient over 22, have PCOS,  family history of premature CHD under age 48, diabetes, smoke, have hypertension or peripheral artery disease:  no At any time, has a partner hit, kicked or otherwise hurt or frightened you?: no Over the past 2 weeks, have you felt down, depressed or hopeless?: no Over the past 2 weeks, have you felt little interest or pleasure in doing things?:no   Gynecologic History Patient's last menstrual period was 08/06/2018. Contraception: OCP (estrogen/progesterone) Last Pap: 2018. Results were: normal Last mammogram: n/a. Results were: n/a  Obstetric History OB History  Gravida Para Term Preterm AB Living  3 2 2   1 2   SAB TAB Ectopic Multiple Live Births        0 2    # Outcome Date GA Lbr Len/2nd Weight Sex Delivery Anes PTL Lv  3 Term 03/23/15 [redacted]w[redacted]d  5 lb 14.9 oz (2.69 kg) M CS-LTranv Spinal  LIV  2 Term 10/24/06 [redacted]w[redacted]d  6 lb 2 oz (2.778 kg) M CS-LTranv Spinal N LIV  1 AB 2004            Past Medical History:  Diagnosis Date  . Allergy   . Medical history non-contributory     Past Surgical History:  Procedure Laterality Date  . CESAREAN SECTION    . CESAREAN SECTION N/A 03/22/2015   Procedure: REPEAT CESAREAN SECTION;  Surgeon: Kathreen Cosier, MD;  Location: [facility];  Service: Obstetrics;  Laterality: N/A;     Current Outpatient Medications:  .  TRI-SPRINTEC 0.18/0.215/0.25 MG-35  MCG tablet, TAKE 1 TABLET BY MOUTH ONCE DAILY, Disp: 28 tablet, Rfl: 11 No Known Allergies  Social History   Tobacco Use  . Smoking status: Current Every Day Smoker    Types: Cigarettes  . Smokeless tobacco: Never Used  Substance Use Topics  . Alcohol use: No    Family History  Problem Relation Age of Onset  . Hypertension Mother   . Diabetes Mother   . Hypertension Maternal Grandmother       Review of Systems  Constitutional: negative for fatigue and weight loss Respiratory: negative for cough and wheezing Cardiovascular: negative for chest pain, fatigue and palpitations Gastrointestinal: negative for abdominal pain and change in bowel habits Musculoskeletal:negative for myalgias Neurological: negative for gait problems and tremors Behavioral/Psych: negative for abusive relationship, depression Endocrine: negative for temperature intolerance    Genitourinary:negative for abnormal menstrual periods, genital lesions, hot flashes, sexual problems.  Positive for malodorous vaginal discharge Integument/breast: negative for breast lump, breast tenderness, nipple discharge and skin lesion(s)    Objective:       BP (!) 151/92   Pulse 76   Ht 5\' 4"  (1.626 m)   Wt 227 lb 9.6 oz (103.2 kg)   LMP 08/06/2018   BMI 39.07 kg/m  General:   alert  Skin:   no rash or abnormalities  Lungs:   clear to auscultation bilaterally  Heart:  regular rate and rhythm, S1, S2 normal, no murmur, click, rub or gallop  Breasts:   normal without suspicious masses, skin or nipple changes or axillary nodes  Abdomen:  normal findings: no organomegaly, soft, non-tender and no hernia  Pelvis:  External genitalia: normal general appearance Urinary system: urethral meatus normal and bladder without fullness, nontender Vaginal: normal without tenderness, induration or masses Cervix: normal appearance Adnexa: normal bimanual exam Uterus: anteverted and non-tender, normal size   Lab Review Urine  pregnancy test Labs reviewed yes Radiologic studies reviewed no  50% of 20 min visit spent on counseling and coordination of care.   Assessment:     1. Encounter for gynecological examination with Papanicolaou smear of cervix Rx: - Cytology - PAP  2. Encounter for surveillance of contraceptive pills Rx: - Norgestimate-Ethinyl Estradiol Triphasic (TRI-SPRINTEC) 0.18/0.215/0.25 MG-35 MCG tablet; Take 1 tablet by mouth daily.  Dispense: 28 tablet; Refill: 11  3. Vaginal discharge Rx: - Cervicovaginal ancillary only - tinidazole (TINDAMAX) 500 MG tablet; Take 2 tablets (1,000 mg total) by mouth daily with breakfast.  Dispense: 10 tablet; Refill: 5    Plan:    Education reviewed: calcium supplements, depression evaluation, low fat, low cholesterol diet, safe sex/STD prevention, self breast exams and weight bearing exercise. Contraception: OCP (estrogen/progesterone). Follow up in: 1 year.   No orders of the defined types were placed in this encounter.   Brock Bad MD 09-03-2018

## 2018-09-03 NOTE — Progress Notes (Signed)
Pt presents for annual exam. Pt states that she has some vaginal discharge w/ slight odor. Last Pap 09/06/18 Abnormal for HPV

## 2018-09-04 LAB — CERVICOVAGINAL ANCILLARY ONLY
BACTERIAL VAGINITIS: POSITIVE — AB
Candida vaginitis: NEGATIVE
Chlamydia: NEGATIVE
NEISSERIA GONORRHEA: NEGATIVE
Trichomonas: NEGATIVE

## 2018-09-05 ENCOUNTER — Other Ambulatory Visit: Payer: Self-pay | Admitting: Obstetrics

## 2018-09-06 LAB — CYTOLOGY - PAP
Diagnosis: NEGATIVE
HPV (WINDOPATH): DETECTED — AB
HPV 16/18/45 GENOTYPING: NEGATIVE

## 2019-01-18 ENCOUNTER — Other Ambulatory Visit: Payer: Self-pay | Admitting: Obstetrics

## 2019-07-05 ENCOUNTER — Other Ambulatory Visit: Payer: Self-pay

## 2019-07-05 ENCOUNTER — Encounter: Payer: Self-pay | Admitting: Registered Nurse

## 2019-07-05 ENCOUNTER — Ambulatory Visit (INDEPENDENT_AMBULATORY_CARE_PROVIDER_SITE_OTHER): Payer: 59 | Admitting: Registered Nurse

## 2019-07-05 VITALS — BP 118/78 | HR 69 | Temp 99.0°F | Resp 18 | Ht 64.57 in | Wt 213.0 lb

## 2019-07-05 DIAGNOSIS — Z1329 Encounter for screening for other suspected endocrine disorder: Secondary | ICD-10-CM | POA: Diagnosis not present

## 2019-07-05 DIAGNOSIS — Z6835 Body mass index (BMI) 35.0-35.9, adult: Secondary | ICD-10-CM | POA: Insufficient documentation

## 2019-07-05 DIAGNOSIS — Z0000 Encounter for general adult medical examination without abnormal findings: Secondary | ICD-10-CM

## 2019-07-05 DIAGNOSIS — Z130 Encounter for screening for diseases of the blood and blood-forming organs and certain disorders involving the immune mechanism: Secondary | ICD-10-CM

## 2019-07-05 DIAGNOSIS — Z13228 Encounter for screening for other metabolic disorders: Secondary | ICD-10-CM

## 2019-07-05 DIAGNOSIS — Z7689 Persons encountering health services in other specified circumstances: Secondary | ICD-10-CM

## 2019-07-05 DIAGNOSIS — Z0001 Encounter for general adult medical examination with abnormal findings: Secondary | ICD-10-CM | POA: Diagnosis not present

## 2019-07-05 DIAGNOSIS — Z13220 Encounter for screening for lipoid disorders: Secondary | ICD-10-CM

## 2019-07-05 NOTE — Patient Instructions (Signed)
° ° ° °  If you have lab work done today you will be contacted with your lab results within the next 2 weeks.  If you have not heard from us then please contact us. The fastest way to get your results is to register for My Chart. ° ° °IF you received an x-ray today, you will receive an invoice from [facility]. Please contact  Radiology at [phone] with questions or concerns regarding your invoice.  ° °IF you received labwork today, you will receive an invoice from [facility]. Please contact [facility] at [phone] with questions or concerns regarding your invoice.  ° °Our billing staff will not be able to assist you with questions regarding bills from these companies. ° °You will be contacted with the lab results as soon as they are available. The fastest way to get your results is to activate your My Chart account. Instructions are located on the last page of this paperwork. If you have not heard from us regarding the results in 2 weeks, please contact this office. °  ° ° ° °

## 2019-07-05 NOTE — Progress Notes (Signed)
New Patient Office Visit  Subjective:  Patient ID: Prudencio PairKia M Ruggiero, female    DOB: 02/13/1986  Age: 33 y.o. MRN: [id number]  CC:  Chief Complaint  Patient presents with  . Establish Care    pt need a new pcp to manage care     HPI Aala Brion AlimentM Perlman presents for visit to establish care and CPE  No urgent complaints. Feels well. Hasn't been seen in primary care for some time - though follows with obgyn regularly.   Past Medical History:  Diagnosis Date  . Allergy   . Hypertension   . Medical history non-contributory     Past Surgical History:  Procedure Laterality Date  . CESAREAN SECTION    . CESAREAN SECTION N/A 03/22/2015   Procedure: REPEAT CESAREAN SECTION;  Surgeon: Kathreen CosierBernard A Marshall, MD;  Location: [facility];  Service: Obstetrics;  Laterality: N/A;    Family History  Problem Relation Age of Onset  . Hypertension Mother   . Diabetes Mother   . Heart disease Mother   . Diabetes Father   . Heart disease Father   . Hypertension Maternal Grandmother   . Diabetes Maternal Grandmother     Social History   Socioeconomic History  . Marital status: Single    Spouse name: Not on file  . Number of children: Not on file  . Years of education: Not on file  . Highest education level: Not on file  Occupational History  . Not on file  Social Needs  . Financial resource strain: Not on file  . Food insecurity    Worry: Not on file    Inability: Not on file  . Transportation needs    Medical: Not on file    Non-medical: Not on file  Tobacco Use  . Smoking status: Current Every Day Smoker    Types: Cigarettes  . Smokeless tobacco: Never Used  Substance and Sexual Activity  . Alcohol use: No  . Drug use: No  . Sexual activity: Yes    Partners: Male    Birth control/protection: None, Pill  Lifestyle  . Physical activity    Days per week: Not on file    Minutes per session: Not on file  . Stress: Not on file  Relationships  . Social Musicianconnections    Talks on phone:  Not on file    Gets together: Not on file    Attends religious service: Not on file    Active member of club or organization: Not on file    Attends meetings of clubs or organizations: Not on file    Relationship status: Not on file  . Intimate partner violence    Fear of current or ex partner: Not on file    Emotionally abused: Not on file    Physically abused: Not on file    Forced sexual activity: Not on file  Other Topics Concern  . Not on file  Social History Narrative  . Not on file    ROS Review of Systems  Constitutional: Negative.   HENT: Negative.   Eyes: Negative.   Respiratory: Negative.   Cardiovascular: Negative.   Gastrointestinal: Negative.   Endocrine: Negative.   Genitourinary: Negative.   Musculoskeletal: Negative.   Skin: Negative.   Allergic/Immunologic: Negative.   Neurological: Negative.   Hematological: Negative.   Psychiatric/Behavioral: Negative.   All other systems reviewed and are negative.   Objective:   Today's Vitals: BP 118/78   Pulse 69   Temp 99  F (37.2 C) (Oral)   Resp 18   Ht 5' 4.57" (1.64 m)   Wt 213 lb (96.6 kg)   LMP 06/19/2019 (Approximate)   SpO2 97%   BMI 35.92 kg/m   Physical Exam Vitals signs and nursing note reviewed.  Constitutional:      General: She is not in acute distress.    Appearance: Normal appearance. She is not ill-appearing, toxic-appearing or diaphoretic.  HENT:     Head: Normocephalic and atraumatic.     Right Ear: Tympanic membrane, ear canal and external ear normal. There is no impacted cerumen.     Left Ear: Tympanic membrane, ear canal and external ear normal. There is no impacted cerumen.     Nose: Nose normal. No congestion or rhinorrhea.     Mouth/Throat:     Mouth: Mucous membranes are moist.     Pharynx: Oropharynx is clear. No oropharyngeal exudate or posterior oropharyngeal erythema.  Eyes:     General: No scleral icterus.       Right eye: No discharge.        Left eye: No  discharge.     Extraocular Movements: Extraocular movements intact.     Conjunctiva/sclera: Conjunctivae normal.     Pupils: Pupils are equal, round, and reactive to light.  Neck:     Musculoskeletal: Normal range of motion and neck supple. No neck rigidity or muscular tenderness.  Cardiovascular:     Rate and Rhythm: Normal rate and regular rhythm.     Pulses: Normal pulses.     Heart sounds: Normal heart sounds. No murmur. No friction rub. No gallop.   Pulmonary:     Effort: Pulmonary effort is normal. No respiratory distress.     Breath sounds: Normal breath sounds. No stridor. No wheezing, rhonchi or rales.  Chest:     Chest wall: No tenderness.  Abdominal:     General: Abdomen is flat. Bowel sounds are normal. There is no distension.     Palpations: Abdomen is soft. There is no mass.     Tenderness: There is no abdominal tenderness. There is no right CVA tenderness, left CVA tenderness, guarding or rebound.     Hernia: No hernia is present.  Genitourinary:    Comments: Deferred. Sees obgyn regularly. Musculoskeletal: Normal range of motion.        General: No swelling, tenderness, deformity or signs of injury.     Right lower leg: No edema.     Left lower leg: No edema.  Lymphadenopathy:     Cervical: No cervical adenopathy.  Skin:    General: Skin is dry.     Capillary Refill: Capillary refill takes less than 2 seconds.     Coloration: Skin is not jaundiced.     Findings: No bruising or lesion.  Neurological:     General: No focal deficit present.     Mental Status: She is alert and oriented to person, place, and time. Mental status is at baseline.  Psychiatric:        Mood and Affect: Mood normal.        Behavior: Behavior normal.        Thought Content: Thought content normal.        Judgment: Judgment normal.     Assessment & Plan:   Problem List Items Addressed This Visit    None    Visit Diagnoses    Screening for endocrine, metabolic and immunity disorder     -  Primary  Relevant Orders   CBC with Differential/Platelet   Comprehensive metabolic panel   Hemoglobin A1c   TSH   Lipid screening       Relevant Orders   Lipid panel   BMI 35.0-35.9,adult       Relevant Orders   Amb Ref to Medical Weight Management      Outpatient Encounter Medications as of 07/05/2019  Medication Sig  . Norgestimate-Ethinyl Estradiol Triphasic (TRI-SPRINTEC) 0.18/0.215/0.25 MG-35 MCG tablet Take 1 tablet by mouth daily.  . [DISCONTINUED] tinidazole (TINDAMAX) 500 MG tablet Take 2 tablets (1,000 mg total) by mouth daily with breakfast.   No facility-administered encounter medications on file as of 07/05/2019.     Follow-up: No follow-ups on file.   PLAN  Ref to Med Weight Management for interest in weight loss and current plateau  Labs drawn, will follow up as warranted  Normal findings on exam  Patient encouraged to call clinic with any questions, comments, or concerns.   Janeece Agee, NP

## 2019-07-06 LAB — COMPREHENSIVE METABOLIC PANEL
ALT: 10 IU/L (ref 0–32)
AST: 18 IU/L (ref 0–40)
Albumin/Globulin Ratio: 1.2 (ref 1.2–2.2)
Albumin: 3.8 g/dL (ref 3.8–4.8)
Alkaline Phosphatase: 38 IU/L — ABNORMAL LOW (ref 39–117)
BUN/Creatinine Ratio: 12 (ref 9–23)
BUN: 10 mg/dL (ref 6–20)
Bilirubin Total: <0.2 mg/dL (ref 0.0–1.2)
CO2: 17 mmol/L — ABNORMAL LOW (ref 20–29)
Calcium: 9 mg/dL (ref 8.7–10.2)
Chloride: 104 mmol/L (ref 96–106)
Creatinine, Ser: 0.82 mg/dL (ref 0.57–1.00)
GFR calc Af Amer: 108 mL/min/{1.73_m2} (ref >59)
GFR calc non Af Amer: 94 mL/min/{1.73_m2} (ref >59)
Globulin, Total: 3.1 g/dL (ref 1.5–4.5)
Glucose: 107 mg/dL — ABNORMAL HIGH (ref 65–99)
Potassium: 4.2 mmol/L (ref 3.5–5.2)
Sodium: 138 mmol/L (ref 134–144)
Total Protein: 6.9 g/dL (ref 6.0–8.5)

## 2019-07-06 LAB — CBC WITH DIFFERENTIAL/PLATELET
Basophils Absolute: 0 10*3/uL (ref 0.0–0.2)
Basos: 0 %
EOS (ABSOLUTE): 0.1 10*3/uL (ref 0.0–0.4)
Eos: 1 %
Hematocrit: 34.6 % (ref 34.0–46.6)
Hemoglobin: 11.4 g/dL (ref 11.1–15.9)
Immature Grans (Abs): 0 10*3/uL (ref 0.0–0.1)
Immature Granulocytes: 0 %
Lymphocytes Absolute: 2.1 10*3/uL (ref 0.7–3.1)
Lymphs: 30 %
MCH: 28.9 pg (ref 26.6–33.0)
MCHC: 32.9 g/dL (ref 31.5–35.7)
MCV: 88 fL (ref 79–97)
Monocytes Absolute: 0.5 10*3/uL (ref 0.1–0.9)
Monocytes: 8 %
Neutrophils Absolute: 4.1 10*3/uL (ref 1.4–7.0)
Neutrophils: 61 %
Platelets: 308 10*3/uL (ref 150–450)
RBC: 3.94 x10E6/uL (ref 3.77–5.28)
RDW: 13.6 % (ref 11.7–15.4)
WBC: 6.8 10*3/uL (ref 3.4–10.8)

## 2019-07-06 LAB — LIPID PANEL
Chol/HDL Ratio: 2.2 ratio (ref 0.0–4.4)
Cholesterol, Total: 223 mg/dL — ABNORMAL HIGH (ref 100–199)
HDL: 100 mg/dL (ref >39)
LDL Chol Calc (NIH): 107 mg/dL — ABNORMAL HIGH (ref 0–99)
Triglycerides: 96 mg/dL (ref 0–149)
VLDL Cholesterol Cal: 16 mg/dL (ref 5–40)

## 2019-07-06 LAB — TSH: TSH: 1.39 u[IU]/mL (ref 0.450–4.500)

## 2019-07-06 LAB — HEMOGLOBIN A1C
Est. average glucose Bld gHb Est-mCnc: 108 mg/dL
Hgb A1c MFr Bld: 5.4 % (ref 4.8–5.6)

## 2019-07-08 ENCOUNTER — Encounter: Payer: Self-pay | Admitting: Registered Nurse

## 2019-07-08 ENCOUNTER — Ambulatory Visit: Payer: Managed Care, Other (non HMO) | Admitting: Nurse Practitioner

## 2019-07-08 NOTE — Progress Notes (Signed)
Results letter sent to patient  Rich Arelly Whittenberg, NP 

## 2019-11-11 ENCOUNTER — Ambulatory Visit: Payer: 59 | Admitting: Registered Nurse

## 2019-11-26 ENCOUNTER — Ambulatory Visit: Payer: 59 | Admitting: Registered Nurse

## 2019-12-03 ENCOUNTER — Ambulatory Visit: Payer: 59 | Admitting: Obstetrics

## 2019-12-10 ENCOUNTER — Ambulatory Visit: Payer: 59 | Admitting: Obstetrics

## 2019-12-17 ENCOUNTER — Ambulatory Visit: Payer: 59 | Admitting: Registered Nurse

## 2019-12-17 ENCOUNTER — Other Ambulatory Visit: Payer: Self-pay

## 2019-12-17 ENCOUNTER — Encounter: Payer: Self-pay | Admitting: Registered Nurse

## 2019-12-17 VITALS — BP 140/92 | HR 80 | Temp 98.3°F | Ht 64.0 in | Wt 217.6 lb

## 2019-12-17 DIAGNOSIS — Z130 Encounter for screening for diseases of the blood and blood-forming organs and certain disorders involving the immune mechanism: Secondary | ICD-10-CM

## 2019-12-17 DIAGNOSIS — Z13220 Encounter for screening for lipoid disorders: Secondary | ICD-10-CM

## 2019-12-17 DIAGNOSIS — Z13228 Encounter for screening for other metabolic disorders: Secondary | ICD-10-CM

## 2019-12-17 DIAGNOSIS — Z1329 Encounter for screening for other suspected endocrine disorder: Secondary | ICD-10-CM | POA: Diagnosis not present

## 2019-12-17 NOTE — Patient Instructions (Signed)
° ° ° °  If you have lab work done today you will be contacted with your lab results within the next 2 weeks.  If you have not heard from us then please contact us. The fastest way to get your results is to register for My Chart. ° ° °IF you received an x-ray today, you will receive an invoice from [facility]. Please contact [facility] at [phone] with questions or concerns regarding your invoice.  ° °IF you received labwork today, you will receive an invoice from [facility]. Please contact [facility] at [phone] with questions or concerns regarding your invoice.  ° °Our billing staff will not be able to assist you with questions regarding bills from these companies. ° °You will be contacted with the lab results as soon as they are available. The fastest way to get your results is to activate your My Chart account. Instructions are located on the last page of this paperwork. If you have not heard from us regarding the results in 2 weeks, please contact this office. °  ° ° ° °

## 2019-12-18 LAB — CBC WITH DIFFERENTIAL/PLATELET
Basophils Absolute: 0 10*3/uL (ref 0.0–0.2)
Basos: 0 %
EOS (ABSOLUTE): 0.1 10*3/uL (ref 0.0–0.4)
Eos: 1 %
Hematocrit: 37.9 % (ref 34.0–46.6)
Hemoglobin: 12.5 g/dL (ref 11.1–15.9)
Immature Grans (Abs): 0 10*3/uL (ref 0.0–0.1)
Immature Granulocytes: 0 %
Lymphocytes Absolute: 2.4 10*3/uL (ref 0.7–3.1)
Lymphs: 30 %
MCH: 29.1 pg (ref 26.6–33.0)
MCHC: 33 g/dL (ref 31.5–35.7)
MCV: 88 fL (ref 79–97)
Monocytes Absolute: 0.6 10*3/uL (ref 0.1–0.9)
Monocytes: 8 %
Neutrophils Absolute: 4.8 10*3/uL (ref 1.4–7.0)
Neutrophils: 61 %
Platelets: 273 10*3/uL (ref 150–450)
RBC: 4.29 x10E6/uL (ref 3.77–5.28)
RDW: 15.2 % (ref 11.7–15.4)
WBC: 7.9 10*3/uL (ref 3.4–10.8)

## 2019-12-18 LAB — URINALYSIS
Bilirubin, UA: NEGATIVE
Glucose, UA: NEGATIVE
Ketones, UA: NEGATIVE
Leukocytes,UA: NEGATIVE
Nitrite, UA: NEGATIVE
Protein,UA: NEGATIVE
Specific Gravity, UA: 1.027 (ref 1.005–1.030)
Urobilinogen, Ur: 0.2 mg/dL (ref 0.2–1.0)
pH, UA: 6.5 (ref 5.0–7.5)

## 2019-12-18 LAB — COMPREHENSIVE METABOLIC PANEL
ALT: 8 IU/L (ref 0–32)
AST: 14 IU/L (ref 0–40)
Albumin/Globulin Ratio: 1.5 (ref 1.2–2.2)
Albumin: 4.1 g/dL (ref 3.8–4.8)
Alkaline Phosphatase: 45 IU/L (ref 39–117)
BUN/Creatinine Ratio: 13 (ref 9–23)
BUN: 11 mg/dL (ref 6–20)
Bilirubin Total: <0.2 mg/dL (ref 0.0–1.2)
CO2: 20 mmol/L (ref 20–29)
Calcium: 8.8 mg/dL (ref 8.7–10.2)
Chloride: 108 mmol/L — ABNORMAL HIGH (ref 96–106)
Creatinine, Ser: 0.87 mg/dL (ref 0.57–1.00)
GFR calc Af Amer: 101 mL/min/{1.73_m2} (ref >59)
GFR calc non Af Amer: 87 mL/min/{1.73_m2} (ref >59)
Globulin, Total: 2.7 g/dL (ref 1.5–4.5)
Glucose: 91 mg/dL (ref 65–99)
Potassium: 4.2 mmol/L (ref 3.5–5.2)
Sodium: 142 mmol/L (ref 134–144)
Total Protein: 6.8 g/dL (ref 6.0–8.5)

## 2019-12-18 LAB — LIPID PANEL
Chol/HDL Ratio: 2.4 ratio (ref 0.0–4.4)
Cholesterol, Total: 236 mg/dL — ABNORMAL HIGH (ref 100–199)
HDL: 98 mg/dL (ref >39)
LDL Chol Calc (NIH): 108 mg/dL — ABNORMAL HIGH (ref 0–99)
Triglycerides: 181 mg/dL — ABNORMAL HIGH (ref 0–149)
VLDL Cholesterol Cal: 30 mg/dL (ref 5–40)

## 2019-12-18 LAB — HEMOGLOBIN A1C
Est. average glucose Bld gHb Est-mCnc: 108 mg/dL
Hgb A1c MFr Bld: 5.4 % (ref 4.8–5.6)

## 2019-12-18 LAB — VITAMIN D 25 HYDROXY (VIT D DEFICIENCY, FRACTURES): Vit D, 25-Hydroxy: 4.6 ng/mL — ABNORMAL LOW (ref 30.0–100.0)

## 2019-12-18 LAB — TSH: TSH: 2.12 u[IU]/mL (ref 0.450–4.500)

## 2019-12-18 NOTE — Progress Notes (Signed)
Good morning,  If we could give Ms. Cremeens a call that would be great. There is nothing to indicate on these labs that she wouldn't be fit for surgery, however, her Vitamin D is drastically low. I am going to send over a prescription strength supplement to be taken once weekly for the next 8 weeks. I'm hoping to see her to recheck her levels after that.   Thank you  Jari Sportsman, NP

## 2019-12-22 ENCOUNTER — Encounter: Payer: Self-pay | Admitting: Registered Nurse

## 2019-12-23 ENCOUNTER — Encounter: Payer: Self-pay | Admitting: Registered Nurse

## 2019-12-23 ENCOUNTER — Other Ambulatory Visit: Payer: Self-pay | Admitting: Registered Nurse

## 2019-12-23 DIAGNOSIS — E559 Vitamin D deficiency, unspecified: Secondary | ICD-10-CM

## 2019-12-23 MED ORDER — VITAMIN D (ERGOCALCIFEROL) 1.25 MG (50000 UNIT) PO CAPS: 50000.0000 [IU] | ORAL_CAPSULE | ORAL | 0 refills | Status: DC

## 2019-12-23 NOTE — Telephone Encounter (Signed)
Patient Elaine Martin is really low and wanted to know was she suppose to be on any prescription. I see where you was going to send one to her pharmacy, but  may have got busy and forgot.  Please Advise.

## 2019-12-23 NOTE — Progress Notes (Signed)
Established Patient Office Visit  Subjective:  Patient ID: Elaine Martin, female    DOB: 04/08/1986  Age: 34 y.o. MRN: [id number]  CC:  Chief Complaint  Patient presents with  . Follow-up    patient states she just want her labs done no other concerns     HPI Elaine Martin presents for pre-op labs  Does not have the paperwork with her Considering liposuction with weight loss provider  Feels very well overall  Hx of vit D deficiency - will recheck today  Past Medical History:  Diagnosis Date  . Allergy   . Hypertension   . Medical history non-contributory     Past Surgical History:  Procedure Laterality Date  . CESAREAN SECTION    . CESAREAN SECTION N/A 03/22/2015   Procedure: REPEAT CESAREAN SECTION;  Surgeon: Kathreen Cosier, MD;  Location: [facility];  Service: Obstetrics;  Laterality: N/A;    Family History  Problem Relation Age of Onset  . Hypertension Mother   . Diabetes Mother   . Heart disease Mother   . Diabetes Father   . Heart disease Father   . Hypertension Maternal Grandmother   . Diabetes Maternal Grandmother     Social History   Socioeconomic History  . Marital status: Single    Spouse name: Not on file  . Number of children: Not on file  . Years of education: Not on file  . Highest education level: Not on file  Occupational History  . Not on file  Tobacco Use  . Smoking status: Current Every Day Smoker    Types: Cigarettes  . Smokeless tobacco: Never Used  Substance and Sexual Activity  . Alcohol use: No  . Drug use: No  . Sexual activity: Yes    Partners: Male    Birth control/protection: None, Pill  Other Topics Concern  . Not on file  Social History Narrative  . Not on file   Social Determinants of Health   Financial Resource Strain:   . Difficulty of Paying Living Expenses: Not on file  Food Insecurity:   . Worried About Programme researcher, broadcasting/film/video in the Last Year: Not on file  . Ran Out of Food in the Last Year: Not on file    Transportation Needs:   . Lack of Transportation (Medical): Not on file  . Lack of Transportation (Non-Medical): Not on file  Physical Activity:   . Days of Exercise per Week: Not on file  . Minutes of Exercise per Session: Not on file  Stress:   . Feeling of Stress : Not on file  Social Connections:   . Frequency of Communication with Friends and Family: Not on file  . Frequency of Social Gatherings with Friends and Family: Not on file  . Attends Religious Services: Not on file  . Active Member of Clubs or Organizations: Not on file  . Attends Banker Meetings: Not on file  . Marital Status: Not on file  Intimate Partner Violence:   . Fear of Current or Ex-Partner: Not on file  . Emotionally Abused: Not on file  . Physically Abused: Not on file  . Sexually Abused: Not on file    Outpatient Medications Prior to Visit  Medication Sig Dispense Refill  . Norgestimate-Ethinyl Estradiol Triphasic (TRI-SPRINTEC) 0.18/0.215/0.25 MG-35 MCG tablet Take 1 tablet by mouth daily. 28 tablet 11   No facility-administered medications prior to visit.    No Known Allergies  ROS Review of Systems  Constitutional: Negative.   HENT: Negative.   Eyes: Negative.   Respiratory: Negative.   Cardiovascular: Negative.   Gastrointestinal: Negative.   Endocrine: Negative.   Genitourinary: Negative.   Musculoskeletal: Negative.   Skin: Negative.   Allergic/Immunologic: Negative.   Neurological: Negative.   Hematological: Negative.   Psychiatric/Behavioral: Negative.   All other systems reviewed and are negative.     Objective:    Physical Exam  Constitutional: She is oriented to person, place, and time. She appears well-developed and well-nourished. No distress.  Cardiovascular: Normal rate, regular rhythm and normal heart sounds. Exam reveals no gallop and no friction rub.  No murmur heard. Pulmonary/Chest: Effort normal and breath sounds normal. No respiratory distress.  She has no wheezes. She has no rales. She exhibits no tenderness.  Neurological: She is alert and oriented to person, place, and time.  Skin: Skin is warm and dry. No rash noted. She is not diaphoretic. No erythema. No pallor.  Psychiatric: She has a normal mood and affect. Her behavior is normal. Judgment and thought content normal.  Nursing note and vitals reviewed.   BP (!) 140/92   Pulse 80   Temp 98.3 F (36.8 C) (Temporal)   Ht 5\' 4"  (1.626 m)   Wt 217 lb 9.6 oz (98.7 kg)   SpO2 100%   BMI 37.35 kg/m  Wt Readings from Last 3 Encounters:  12/17/19 217 lb 9.6 oz (98.7 kg)  07/05/19 213 lb (96.6 kg)  09/03/18 227 lb 9.6 oz (103.2 kg)     There are no preventive care reminders to display for this patient.  There are no preventive care reminders to display for this patient.  Lab Results  Component Value Date   TSH 2.120 12/17/2019   Lab Results  Component Value Date   WBC 7.9 12/17/2019   HGB 12.5 12/17/2019   HCT 37.9 12/17/2019   MCV 88 12/17/2019   PLT 273 12/17/2019   Lab Results  Component Value Date   NA 142 12/17/2019   K 4.2 12/17/2019   CO2 20 12/17/2019   GLUCOSE 91 12/17/2019   BUN 11 12/17/2019   CREATININE 0.87 12/17/2019   BILITOT <0.2 12/17/2019   ALKPHOS 45 12/17/2019   AST 14 12/17/2019   ALT 8 12/17/2019   PROT 6.8 12/17/2019   ALBUMIN 4.1 12/17/2019   CALCIUM 8.8 12/17/2019   ANIONGAP 8.0 03/22/2015   GFR 71.15 08/17/2015   Lab Results  Component Value Date   CHOL 236 (H) 12/17/2019   Lab Results  Component Value Date   HDL 98 12/17/2019   Lab Results  Component Value Date   LDLCALC 108 (H) 12/17/2019   Lab Results  Component Value Date   TRIG 181 (H) 12/17/2019   Lab Results  Component Value Date   CHOLHDL 2.4 12/17/2019   Lab Results  Component Value Date   HGBA1C 5.4 12/17/2019      Assessment & Plan:   Problem List Items Addressed This Visit    None    Visit Diagnoses    Screening for endocrine, metabolic  and immunity disorder    -  Primary   Relevant Orders   Comprehensive metabolic panel (Completed)   Hemoglobin A1c (Completed)   CBC with Differential (Completed)   TSH (Completed)   Vitamin D, 25-hydroxy (Completed)   Urinalysis (Completed)   Lipid screening       Relevant Orders   Lipid Panel (Completed)      No orders of the defined  types were placed in this encounter.   Follow-up: No follow-ups on file.   PLAN  Labs drawn, will follow up as warranted  Questions about surgery addressed to the best of my ability  Patient encouraged to call clinic with any questions, comments, or concerns.  Maximiano Coss, NP

## 2019-12-25 ENCOUNTER — Other Ambulatory Visit: Payer: Self-pay | Admitting: Obstetrics

## 2019-12-25 DIAGNOSIS — Z3041 Encounter for surveillance of contraceptive pills: Secondary | ICD-10-CM

## 2019-12-26 ENCOUNTER — Ambulatory Visit: Payer: 59 | Admitting: Obstetrics

## 2020-01-10 ENCOUNTER — Other Ambulatory Visit: Payer: Self-pay | Admitting: Obstetrics

## 2020-01-10 DIAGNOSIS — Z3041 Encounter for surveillance of contraceptive pills: Secondary | ICD-10-CM

## 2020-01-15 ENCOUNTER — Other Ambulatory Visit: Payer: Self-pay

## 2020-01-15 DIAGNOSIS — Z3041 Encounter for surveillance of contraceptive pills: Secondary | ICD-10-CM

## 2020-01-15 MED ORDER — NORGESTIM-ETH ESTRAD TRIPHASIC 0.18/0.215/0.25 MG-35 MCG PO TABS: 1.0000 | ORAL_TABLET | Freq: Every day | ORAL | 3 refills | Status: DC

## 2020-01-30 ENCOUNTER — Encounter: Payer: Self-pay | Admitting: Obstetrics

## 2020-01-30 ENCOUNTER — Other Ambulatory Visit (HOSPITAL_COMMUNITY)
Admission: RE | Admit: 2020-01-30 | Discharge: 2020-01-30 | Disposition: A | Discharge status: Home / Self Care | Payer: 59 | Source: Ambulatory Visit | Attending: Obstetrics | Admitting: Obstetrics

## 2020-01-30 ENCOUNTER — Ambulatory Visit (INDEPENDENT_AMBULATORY_CARE_PROVIDER_SITE_OTHER): Payer: 59 | Admitting: Obstetrics

## 2020-01-30 ENCOUNTER — Other Ambulatory Visit: Payer: Self-pay

## 2020-01-30 VITALS — BP 131/74 | HR 89 | Ht 64.0 in | Wt 216.3 lb

## 2020-01-30 DIAGNOSIS — N898 Other specified noninflammatory disorders of vagina: Secondary | ICD-10-CM | POA: Diagnosis present

## 2020-01-30 DIAGNOSIS — Z01419 Encounter for gynecological examination (general) (routine) without abnormal findings: Secondary | ICD-10-CM | POA: Diagnosis present

## 2020-01-30 DIAGNOSIS — J302 Other seasonal allergic rhinitis: Secondary | ICD-10-CM | POA: Diagnosis not present

## 2020-01-30 DIAGNOSIS — Z3041 Encounter for surveillance of contraceptive pills: Secondary | ICD-10-CM

## 2020-01-30 DIAGNOSIS — Z1272 Encounter for screening for malignant neoplasm of vagina: Secondary | ICD-10-CM

## 2020-01-30 MED ORDER — NORGESTIM-ETH ESTRAD TRIPHASIC 0.18/0.215/0.25 MG-35 MCG PO TABS: 1.0000 | ORAL_TABLET | Freq: Every day | ORAL | 3 refills | Status: DC

## 2020-01-30 MED ORDER — LORATADINE 10 MG PO TABS: 10.0000 mg | ORAL_TABLET | Freq: Every day | ORAL | 11 refills | Status: DC

## 2020-01-30 NOTE — Progress Notes (Addendum)
Subjective:        Elaine Martin is a 34 y.o. female here for a routine exam.  Current complaints: None.    Personal health questionnaire:  Is patient Ashkenazi Jewish, have a family history of breast and/or ovarian cancer: no Is there a family history of uterine cancer diagnosed at age < 58, gastrointestinal cancer, urinary tract cancer, family member who is a Field seismologist syndrome-associated carrier: no Is the patient overweight and hypertensive, family history of diabetes, personal history of gestational diabetes, preeclampsia or PCOS: yes Is patient over 100, have PCOS,  family history of premature CHD under age 63, diabetes, smoke, have hypertension or peripheral artery disease:  no At any time, has a partner hit, kicked or otherwise hurt or frightened you?: no Over the past 2 weeks, have you felt down, depressed or hopeless?: no Over the past 2 weeks, have you felt little interest or pleasure in doing things?:no   Gynecologic History Patient's last menstrual period was 01/23/2020. Contraception: OCP (estrogen/progesterone) Last Pap: 09-03-2018. Results were: normal Last mammogram: n/a. Results were: n/a  Obstetric History OB History  Gravida Para Term Preterm AB Living  3 2 2   1 2   SAB TAB Ectopic Multiple Live Births        0 2    # Outcome Date GA Lbr Len/2nd Weight Sex Delivery Anes PTL Lv  3 Term 03/23/15 [redacted]w[redacted]d  5 lb 14.9 oz (2.69 kg) M CS-LTranv Spinal  LIV  2 Term 10/24/06 [redacted]w[redacted]d  6 lb 2 oz (2.778 kg) M CS-LTranv Spinal N LIV  1 AB 2004            Past Medical History:  Diagnosis Date  . Allergy   . Hypertension   . Medical history non-contributory     Past Surgical History:  Procedure Laterality Date  . CESAREAN SECTION    . CESAREAN SECTION N/A 03/22/2015   Procedure: REPEAT CESAREAN SECTION;  Surgeon: Frederico Hamman, MD;  Location: [facility];  Service: Obstetrics;  Laterality: N/A;     Current Outpatient Medications:  .  Norgestimate-Ethinyl Estradiol  Triphasic (TRI-ESTARYLLA) 0.18/0.215/0.25 MG-35 MCG tablet, Take 1 tablet by mouth daily., Disp: 84 tablet, Rfl: 3 .  Vitamin D, Ergocalciferol, (DRISDOL) 1.25 MG (50000 UNIT) CAPS capsule, Take 1 capsule (50,000 Units total) by mouth every 7 (seven) days., Disp: 8 capsule, Rfl: 0 .  loratadine (CLARITIN) 10 MG tablet, Take 1 tablet (10 mg total) by mouth daily., Disp: 30 tablet, Rfl: 11 No Known Allergies  Social History   Tobacco Use  . Smoking status: Current Some Day Smoker    Types: Cigarettes  . Smokeless tobacco: Never Used  Substance Use Topics  . Alcohol use: Yes    Comment: occ    Family History  Problem Relation Age of Onset  . Hypertension Mother   . Diabetes Mother   . Heart disease Mother   . Diabetes Father   . Heart disease Father   . Hypertension Maternal Grandmother   . Diabetes Maternal Grandmother       Review of Systems  Constitutional: negative for fatigue and weight loss Respiratory: negative for cough and wheezing Cardiovascular: negative for chest pain, fatigue and palpitations Gastrointestinal: negative for abdominal pain and change in bowel habits Musculoskeletal:negative for myalgias Neurological: negative for gait problems and tremors Behavioral/Psych: negative for abusive relationship, depression Endocrine: negative for temperature intolerance    Genitourinary:negative for abnormal menstrual periods, genital lesions, hot flashes, sexual problems and vaginal  discharge Integument/breast: negative for breast lump, breast tenderness, nipple discharge and skin lesion(s)    Objective:       BP 131/74   Pulse 89   Ht 5\' 4"  (1.626 m)   Wt 216 lb 4.8 oz (98.1 kg)   LMP 01/23/2020   BMI 37.13 kg/m  General:   alert  Skin:   no rash or abnormalities  Lungs:   clear to auscultation bilaterally  Heart:   regular rate and rhythm, S1, S2 normal, no murmur, click, rub or gallop  Breasts:   normal without suspicious masses, skin or nipple changes or  axillary nodes  Abdomen:  normal findings: no organomegaly, soft, non-tender and no hernia  Pelvis:  External genitalia: normal general appearance Urinary system: urethral meatus normal and bladder without fullness, nontender Vaginal: normal without tenderness, induration or masses Cervix: normal appearance Adnexa: normal bimanual exam Uterus: anteverted and non-tender, normal size   Lab Review Urine pregnancy test Labs reviewed yes Radiologic studies reviewed no  50% of 25 min visit spent on counseling and coordination of care.   Assessment:     1. Encounter for gynecological examination with Papanicolaou smear of cervix Rx: - Cytology - PAP( [place])  2. Vaginal discharge Rx: - Cervicovaginal ancillary only( [place])  3. Seasonal allergies Rx: - loratadine (CLARITIN) 10 MG tablet; Take 1 tablet (10 mg total) by mouth daily.  Dispense: 30 tablet; Refill: 11  4. Encounter for surveillance of contraceptive pills Rx: - Norgestimate-Ethinyl Estradiol Triphasic (TRI-ESTARYLLA) 0.18/0.215/0.25 MG-35 MCG tablet; Take 1 tablet by mouth daily.  Dispense: 84 tablet; Refill: 3    Plan:    Education reviewed: calcium supplements, depression evaluation, low fat, low cholesterol diet, safe sex/STD prevention, self breast exams and weight bearing exercise. Contraception: OCP (estrogen/progesterone). Follow up in: 1 year.   Meds ordered this encounter  Medications  . loratadine (CLARITIN) 10 MG tablet    Sig: Take 1 tablet (10 mg total) by mouth daily.    Dispense:  30 tablet    Refill:  11  . Norgestimate-Ethinyl Estradiol Triphasic (TRI-ESTARYLLA) 0.18/0.215/0.25 MG-35 MCG tablet    Sig: Take 1 tablet by mouth daily.    Dispense:  84 tablet    Refill:  3     11-[birth year]-1981, MD 01/30/2020 4:03 PM

## 2020-01-30 NOTE — Progress Notes (Signed)
Patient presents for AEX. Patient has no concerns today. She desires to have STI testing, but no bloodwork.  Last Pap: 09/11/2018 HPV detected

## 2020-01-31 LAB — CERVICOVAGINAL ANCILLARY ONLY
Bacterial Vaginitis (gardnerella): POSITIVE — AB
Candida Glabrata: NEGATIVE
Candida Vaginitis: NEGATIVE
Chlamydia: NEGATIVE
Comment: NEGATIVE
Comment: NEGATIVE
Comment: NEGATIVE
Comment: NEGATIVE
Comment: NEGATIVE
Comment: NORMAL
Neisseria Gonorrhea: NEGATIVE
Trichomonas: NEGATIVE

## 2020-01-31 LAB — CYTOLOGY - PAP
Comment: NEGATIVE
Diagnosis: NEGATIVE
High risk HPV: NEGATIVE

## 2020-02-01 ENCOUNTER — Other Ambulatory Visit: Payer: Self-pay | Admitting: Obstetrics

## 2020-02-01 DIAGNOSIS — B9689 Other specified bacterial agents as the cause of diseases classified elsewhere: Secondary | ICD-10-CM

## 2020-02-01 DIAGNOSIS — N760 Acute vaginitis: Secondary | ICD-10-CM

## 2020-02-01 MED ORDER — TINIDAZOLE 500 MG PO TABS: 1000.0000 mg | ORAL_TABLET | Freq: Every day | ORAL | 2 refills | Status: DC

## 2020-03-02 ENCOUNTER — Telehealth: Payer: Self-pay

## 2020-03-02 ENCOUNTER — Other Ambulatory Visit: Payer: Self-pay

## 2020-03-02 ENCOUNTER — Ambulatory Visit (INDEPENDENT_AMBULATORY_CARE_PROVIDER_SITE_OTHER): Payer: 59 | Admitting: Registered Nurse

## 2020-03-02 ENCOUNTER — Encounter: Payer: Self-pay | Admitting: Registered Nurse

## 2020-03-02 VITALS — BP 132/85 | HR 67 | Temp 98.8°F | Resp 16 | Ht 65.0 in | Wt 215.8 lb

## 2020-03-02 DIAGNOSIS — Z01818 Encounter for other preprocedural examination: Secondary | ICD-10-CM | POA: Diagnosis not present

## 2020-03-02 LAB — POCT GLYCOSYLATED HEMOGLOBIN (HGB A1C): Hemoglobin A1C: 5.5 % (ref 4.0–5.6)

## 2020-03-02 NOTE — Telephone Encounter (Signed)
Spoke with the patient

## 2020-03-02 NOTE — Progress Notes (Signed)
Established Patient Office Visit  Subjective:  Patient ID: Elaine Martin, female    DOB: 07/04/1986  Age: 34 y.o. MRN: [id number]  CC:  Chief Complaint  Patient presents with  . Medical Clearance    medical clearance for surgery. and paperwork filled out     HPI Elaine Martin presents for preoperative visit  Having upcoming weight loss surgery. Feeling good about this - doing this "for herself". No urgent concerns. Recently finished rx vitamin d supplementation - noticeable increase in energy, decrease in fatigue, less weakness.   Otherwise, no concerns. No medical history that would indicate she would not be a good candidate for this surgery.  Past Medical History:  Diagnosis Date  . Allergy   . Hypertension   . Medical history non-contributory     Past Surgical History:  Procedure Laterality Date  . CESAREAN SECTION    . CESAREAN SECTION N/A 03/22/2015   Procedure: REPEAT CESAREAN SECTION;  Surgeon: Kathreen Cosier, MD;  Location: [facility];  Service: Obstetrics;  Laterality: N/A;    Family History  Problem Relation Age of Onset  . Hypertension Mother   . Diabetes Mother   . Heart disease Mother   . Diabetes Father   . Heart disease Father   . Hypertension Maternal Grandmother   . Diabetes Maternal Grandmother     Social History   Socioeconomic History  . Marital status: Single    Spouse name: Not on file  . Number of children: Not on file  . Years of education: Not on file  . Highest education level: Not on file  Occupational History  . Not on file  Tobacco Use  . Smoking status: Current Some Day Smoker    Types: Cigarettes  . Smokeless tobacco: Never Used  Substance and Sexual Activity  . Alcohol use: Yes    Comment: occ  . Drug use: No  . Sexual activity: Yes    Partners: Male    Birth control/protection: None, Pill  Other Topics Concern  . Not on file  Social History Narrative  . Not on file   Social Determinants of Health   Financial  Resource Strain:   . Difficulty of Paying Living Expenses:   Food Insecurity:   . Worried About Programme researcher, broadcasting/film/video in the Last Year:   . Barista in the Last Year:   Transportation Needs:   . Freight forwarder (Medical):   Marland Kitchen Lack of Transportation (Non-Medical):   Physical Activity:   . Days of Exercise per Week:   . Minutes of Exercise per Session:   Stress:   . Feeling of Stress :   Social Connections:   . Frequency of Communication with Friends and Family:   . Frequency of Social Gatherings with Friends and Family:   . Attends Religious Services:   . Active Member of Clubs or Organizations:   . Attends Banker Meetings:   Marland Kitchen Marital Status:   Intimate Partner Violence:   . Fear of Current or Ex-Partner:   . Emotionally Abused:   Marland Kitchen Physically Abused:   . Sexually Abused:     Outpatient Medications Prior to Visit  Medication Sig Dispense Refill  . loratadine (CLARITIN) 10 MG tablet Take 1 tablet (10 mg total) by mouth daily. 30 tablet 11  . Norgestimate-Ethinyl Estradiol Triphasic (TRI-ESTARYLLA) 0.18/0.215/0.25 MG-35 MCG tablet Take 1 tablet by mouth daily. 84 tablet 3  . tinidazole (TINDAMAX) 500 MG tablet Take 2  tablets (1,000 mg total) by mouth daily with breakfast. 10 tablet 2  . Vitamin D, Ergocalciferol, (DRISDOL) 1.25 MG (50000 UNIT) CAPS capsule Take 1 capsule (50,000 Units total) by mouth every 7 (seven) days. (Patient not taking: Reported on 03/02/2020) 8 capsule 0   No facility-administered medications prior to visit.    No Known Allergies  ROS Review of Systems  Constitutional: Negative.   HENT: Negative.   Eyes: Negative.   Respiratory: Negative.   Cardiovascular: Negative.   Gastrointestinal: Negative.   Endocrine: Negative.   Genitourinary: Negative.   Musculoskeletal: Negative.   Skin: Negative.   Allergic/Immunologic: Negative.   Neurological: Negative.   Hematological: Negative.   Psychiatric/Behavioral: Negative.    All other systems reviewed and are negative.     Objective:    Physical Exam  Constitutional: She is oriented to person, place, and time. She appears well-developed and well-nourished. No distress.  Cardiovascular: Normal rate, normal heart sounds, intact distal pulses and normal pulses. A regularly irregular rhythm present.  No extrasystoles are present. PMI is not displaced. Exam reveals no gallop, no friction rub and no decreased pulses.  No murmur heard. Respiratory sinus arrhythmia.   Pulmonary/Chest: Effort normal and breath sounds normal. No respiratory distress. She has no wheezes. She has no rales. She exhibits no tenderness.  Abdominal: Soft. Bowel sounds are normal. She exhibits no distension and no mass. There is no abdominal tenderness. There is no rebound and no guarding.  Musculoskeletal:        General: No tenderness, deformity or edema. Normal range of motion.  Neurological: She is alert and oriented to person, place, and time. No cranial nerve deficit. She exhibits normal muscle tone. Coordination normal.  Skin: Skin is warm and dry. No rash noted. She is not diaphoretic. No erythema. No pallor.  Psychiatric: She has a normal mood and affect. Her behavior is normal. Judgment and thought content normal.  Nursing note and vitals reviewed.   BP 132/85   Pulse 67   Temp 98.8 F (37.1 C) (Temporal)   Resp 16   Ht 5\' 5"  (1.651 m)   Wt 215 lb 12.8 oz (97.9 kg)   LMP 02/19/2020   SpO2 100%   BMI 35.91 kg/m  Wt Readings from Last 3 Encounters:  03/02/20 215 lb 12.8 oz (97.9 kg)  01/30/20 216 lb 4.8 oz (98.1 kg)  12/17/19 217 lb 9.6 oz (98.7 kg)     Health Maintenance Due  Topic Date Due  . COVID-19 Vaccine (1) Never done    There are no preventive care reminders to display for this patient.  Lab Results  Component Value Date   TSH 2.120 12/17/2019   Lab Results  Component Value Date   WBC 7.9 12/17/2019   HGB 12.5 12/17/2019   HCT 37.9 12/17/2019   MCV  88 12/17/2019   PLT 273 12/17/2019   Lab Results  Component Value Date   NA 142 12/17/2019   K 4.2 12/17/2019   CO2 20 12/17/2019   GLUCOSE 91 12/17/2019   BUN 11 12/17/2019   CREATININE 0.87 12/17/2019   BILITOT <0.2 12/17/2019   ALKPHOS 45 12/17/2019   AST 14 12/17/2019   ALT 8 12/17/2019   PROT 6.8 12/17/2019   ALBUMIN 4.1 12/17/2019   CALCIUM 8.8 12/17/2019   ANIONGAP 8.0 03/22/2015   GFR 71.15 08/17/2015   Lab Results  Component Value Date   CHOL 236 (H) 12/17/2019   Lab Results  Component Value Date  HDL 98 12/17/2019   Lab Results  Component Value Date   LDLCALC 108 (H) 12/17/2019   Lab Results  Component Value Date   TRIG 181 (H) 12/17/2019   Lab Results  Component Value Date   CHOLHDL 2.4 12/17/2019   Lab Results  Component Value Date   HGBA1C 5.4 12/17/2019      Assessment & Plan:   Problem List Items Addressed This Visit    None    Visit Diagnoses    Preoperative examination    -  Primary   Relevant Orders   CBC With Differential   Comprehensive metabolic panel   POCT glycosylated hemoglobin (Hb A1C)   Protime-INR   EKG 12-Lead (Completed)   HIV antibody (with reflex)   APTT   Urinalysis   POCT urine pregnancy      No orders of the defined types were placed in this encounter.   Follow-up: No follow-ups on file.   PLAN  Irregular rate noted on EKG - on exam, confirmed to be respiratory sinus arrhythmia. This does not warrant cardiology work up or exclusion from candidacy for operation.  Otherwise normal findings on exam. Labs collected. Will fill out her form and follow up as warranted  Patient encouraged to call clinic with any questions, comments, or concerns.  Maximiano Coss, NP

## 2020-03-02 NOTE — Addendum Note (Signed)
Addended by: Nicholaus Corolla on: 03/02/2020 03:25 PM   Modules accepted: Orders

## 2020-03-02 NOTE — Patient Instructions (Signed)
° ° ° °  If you have lab work done today you will be contacted with your lab results within the next 2 weeks.  If you have not heard from us then please contact us. The fastest way to get your results is to register for My Chart. ° ° °IF you received an x-ray today, you will receive an invoice from [facility]. Please contact  Radiology at [phone] with questions or concerns regarding your invoice.  ° °IF you received labwork today, you will receive an invoice from [facility]. Please contact [facility] at [phone] with questions or concerns regarding your invoice.  ° °Our billing staff will not be able to assist you with questions regarding bills from these companies. ° °You will be contacted with the lab results as soon as they are available. The fastest way to get your results is to activate your My Chart account. Instructions are located on the last page of this paperwork. If you have not heard from us regarding the results in 2 weeks, please contact this office. °  ° ° ° °

## 2020-03-03 ENCOUNTER — Ambulatory Visit (INDEPENDENT_AMBULATORY_CARE_PROVIDER_SITE_OTHER): Payer: 59 | Admitting: Registered Nurse

## 2020-03-03 DIAGNOSIS — Z01818 Encounter for other preprocedural examination: Secondary | ICD-10-CM

## 2020-03-03 LAB — POCT URINALYSIS DIP (MANUAL ENTRY)
Bilirubin, UA: NEGATIVE
Glucose, UA: NEGATIVE mg/dL
Ketones, POC UA: NEGATIVE mg/dL
Nitrite, UA: NEGATIVE
Protein Ur, POC: NEGATIVE mg/dL
Spec Grav, UA: >=1.03 — AB (ref 1.010–1.025)
Urobilinogen, UA: 0.2 E.U./dL
pH, UA: 5.5 (ref 5.0–8.0)

## 2020-03-03 LAB — PROTIME-INR
INR: 1.1 (ref 0.9–1.2)
Prothrombin Time: 11.6 s (ref 9.1–12.0)

## 2020-03-03 LAB — CBC WITH DIFFERENTIAL
Basophils Absolute: 0 10*3/uL (ref 0.0–0.2)
Basos: 0 %
EOS (ABSOLUTE): 0.1 10*3/uL (ref 0.0–0.4)
Eos: 1 %
Hematocrit: 38 % (ref 34.0–46.6)
Hemoglobin: 12.4 g/dL (ref 11.1–15.9)
Immature Grans (Abs): 0 10*3/uL (ref 0.0–0.1)
Immature Granulocytes: 0 %
Lymphocytes Absolute: 2 10*3/uL (ref 0.7–3.1)
Lymphs: 35 %
MCH: 29 pg (ref 26.6–33.0)
MCHC: 32.6 g/dL (ref 31.5–35.7)
MCV: 89 fL (ref 79–97)
Monocytes Absolute: 0.5 10*3/uL (ref 0.1–0.9)
Monocytes: 9 %
Neutrophils Absolute: 3.2 10*3/uL (ref 1.4–7.0)
Neutrophils: 55 %
RBC: 4.27 x10E6/uL (ref 3.77–5.28)
RDW: 14 % (ref 11.7–15.4)
WBC: 5.8 10*3/uL (ref 3.4–10.8)

## 2020-03-03 LAB — COMPREHENSIVE METABOLIC PANEL
ALT: 16 IU/L (ref 0–32)
AST: 21 IU/L (ref 0–40)
Albumin/Globulin Ratio: 1.7 (ref 1.2–2.2)
Albumin: 4.4 g/dL (ref 3.8–4.8)
Alkaline Phosphatase: 44 IU/L (ref 39–117)
BUN/Creatinine Ratio: 9 (ref 9–23)
BUN: 7 mg/dL (ref 6–20)
Bilirubin Total: 0.3 mg/dL (ref 0.0–1.2)
CO2: 18 mmol/L — ABNORMAL LOW (ref 20–29)
Calcium: 9.5 mg/dL (ref 8.7–10.2)
Chloride: 106 mmol/L (ref 96–106)
Creatinine, Ser: 0.79 mg/dL (ref 0.57–1.00)
GFR calc Af Amer: 113 mL/min/{1.73_m2} (ref >59)
GFR calc non Af Amer: 98 mL/min/{1.73_m2} (ref >59)
Globulin, Total: 2.6 g/dL (ref 1.5–4.5)
Glucose: 85 mg/dL (ref 65–99)
Potassium: 4.4 mmol/L (ref 3.5–5.2)
Sodium: 141 mmol/L (ref 134–144)
Total Protein: 7 g/dL (ref 6.0–8.5)

## 2020-03-03 LAB — POCT URINE PREGNANCY: Preg Test, Ur: NEGATIVE

## 2020-03-03 LAB — HIV ANTIBODY (ROUTINE TESTING W REFLEX): HIV Screen 4th Generation wRfx: NONREACTIVE

## 2020-03-03 LAB — APTT: aPTT: 27 s (ref 24–33)

## 2020-03-03 NOTE — Addendum Note (Signed)
Addended by: Isaac Bliss on: 03/03/2020 10:27 AM   Modules accepted: Orders

## 2020-03-03 NOTE — Addendum Note (Signed)
Addended by: Isaac Bliss on: 03/03/2020 10:28 AM   Modules accepted: Orders

## 2020-03-05 ENCOUNTER — Other Ambulatory Visit: Payer: Self-pay | Admitting: Registered Nurse

## 2020-03-05 ENCOUNTER — Telehealth: Payer: Self-pay

## 2020-03-05 ENCOUNTER — Encounter: Payer: Self-pay | Admitting: Registered Nurse

## 2020-03-05 DIAGNOSIS — R82998 Other abnormal findings in urine: Secondary | ICD-10-CM

## 2020-03-05 LAB — HCG, SERUM, QUALITATIVE: hCG,Beta Subunit,Qual,Serum: NEGATIVE m[IU]/mL (ref <6)

## 2020-03-05 LAB — SPECIMEN STATUS REPORT

## 2020-03-05 MED ORDER — SULFAMETHOXAZOLE-TRIMETHOPRIM 800-160 MG PO TABS: 1.0000 | ORAL_TABLET | Freq: Two times a day (BID) | ORAL | 0 refills | Status: DC

## 2020-03-05 NOTE — Telephone Encounter (Signed)
Pt surgical clearance form has been faxed to [phone].Marland Kitchen Pt is aware and will call [employer] to follow up.

## 2020-03-05 NOTE — Progress Notes (Signed)
Hello -  If we could call Elaine Martin - her Urine shows a potential UTI that we'll want to treat prior to her surgery with Bactrim PO bid for three days.   Otherwise her labs are normal and she's fit to proceed with surgery  Thank you  Jari Sportsman, NP

## 2020-03-06 ENCOUNTER — Other Ambulatory Visit: Payer: Self-pay

## 2020-03-06 ENCOUNTER — Telehealth: Payer: Self-pay

## 2020-03-06 ENCOUNTER — Other Ambulatory Visit: Payer: Self-pay | Admitting: *Deleted

## 2020-03-06 DIAGNOSIS — Z01818 Encounter for other preprocedural examination: Secondary | ICD-10-CM

## 2020-03-06 NOTE — Progress Notes (Signed)
done

## 2020-03-06 NOTE — Telephone Encounter (Signed)
Elaine Martin needed the labs and ekg to be faxed again to [facility]. A copy of the surgical clearance will be filed at nurses station.

## 2020-03-06 NOTE — Telephone Encounter (Signed)
[facility] faxed form for pt to have additional test done to show platelet count needed for upcoming surgery. Faxed bk to [facility] at [phone]. Copy of the form will be filed at nurses station

## 2020-03-09 LAB — SPECIMEN STATUS REPORT

## 2020-03-10 ENCOUNTER — Other Ambulatory Visit: Payer: Self-pay

## 2020-03-10 ENCOUNTER — Ambulatory Visit: Payer: 59 | Admitting: Registered Nurse

## 2020-03-10 ENCOUNTER — Encounter: Payer: Self-pay | Admitting: Registered Nurse

## 2020-03-10 VITALS — BP 125/81 | HR 76 | Temp 98.0°F | Resp 16 | Ht 65.0 in | Wt 215.6 lb

## 2020-03-10 DIAGNOSIS — Z01818 Encounter for other preprocedural examination: Secondary | ICD-10-CM

## 2020-03-10 DIAGNOSIS — Z0000 Encounter for general adult medical examination without abnormal findings: Secondary | ICD-10-CM

## 2020-03-10 LAB — CBC WITH DIFFERENTIAL/PLATELET
Basophils Absolute: 0 10*3/uL (ref 0.0–0.2)
Basos: 0 %
EOS (ABSOLUTE): 0.1 10*3/uL (ref 0.0–0.4)
Eos: 1 %
Hematocrit: 41 % (ref 34.0–46.6)
Hemoglobin: 12.7 g/dL (ref 11.1–15.9)
Immature Grans (Abs): 0 10*3/uL (ref 0.0–0.1)
Immature Granulocytes: 0 %
Lymphocytes Absolute: 2.6 10*3/uL (ref 0.7–3.1)
Lymphs: 32 %
MCH: 28.2 pg (ref 26.6–33.0)
MCHC: 31 g/dL — ABNORMAL LOW (ref 31.5–35.7)
MCV: 91 fL (ref 79–97)
Monocytes Absolute: 0.6 10*3/uL (ref 0.1–0.9)
Monocytes: 7 %
Neutrophils Absolute: 4.6 10*3/uL (ref 1.4–7.0)
Neutrophils: 60 %
Platelets: 251 10*3/uL (ref 150–450)
RBC: 4.51 x10E6/uL (ref 3.77–5.28)
RDW: 14.1 % (ref 11.7–15.4)
WBC: 7.9 10*3/uL (ref 3.4–10.8)

## 2020-03-10 LAB — URINALYSIS
Bilirubin, UA: NEGATIVE
Glucose, UA: NEGATIVE
Ketones, UA: NEGATIVE
Leukocytes,UA: NEGATIVE
Nitrite, UA: NEGATIVE
Protein,UA: NEGATIVE
Specific Gravity, UA: 1.027 (ref 1.005–1.030)
Urobilinogen, Ur: 0.2 mg/dL (ref 0.2–1.0)
pH, UA: 5.5 (ref 5.0–7.5)

## 2020-03-10 NOTE — Patient Instructions (Signed)
° ° ° °  If you have lab work done today you will be contacted with your lab results within the next 2 weeks.  If you have not heard from us then please contact us. The fastest way to get your results is to register for My Chart. ° ° °IF you received an x-ray today, you will receive an invoice from [facility]. Please contact [facility] at [phone] with questions or concerns regarding your invoice.  ° °IF you received labwork today, you will receive an invoice from [facility]. Please contact [facility] at [phone] with questions or concerns regarding your invoice.  ° °Our billing staff will not be able to assist you with questions regarding bills from these companies. ° °You will be contacted with the lab results as soon as they are available. The fastest way to get your results is to activate your My Chart account. Instructions are located on the last page of this paperwork. If you have not heard from us regarding the results in 2 weeks, please contact this office. °  ° ° ° °

## 2020-03-10 NOTE — Progress Notes (Signed)
Established Patient Office Visit  Subjective:  Patient ID: Elaine Martin, female    DOB: [birth year]  Age: 34 y.o. MRN: [id number]  CC:  Chief Complaint  Patient presents with  . Follow-up    labs and ekg for medical clearance     HPI Elaine Martin presents for preoperative exam  Was seen previously, unfortunately, abnormalities on ekg and urinalysis warranted second visit  No complaints or concern. Upon review of previous EKG, no acute abnormalities noted. Suggestion of RA enlargement not correlated by exam or history. Will repeat EKG today and send to cardiology for follow up if warranted.  Otherwise she is feeling well and is without concern.  Past Medical History:  Diagnosis Date  . Allergy   . Hypertension   . Medical history non-contributory     Past Surgical History:  Procedure Laterality Date  . CESAREAN SECTION    . CESAREAN SECTION N/A 03/22/2015   Procedure: REPEAT CESAREAN SECTION;  Surgeon: Kathreen Cosier, MD;  Location: [facility];  Service: Obstetrics;  Laterality: N/A;    Family History  Problem Relation Age of Onset  . Hypertension Mother   . Diabetes Mother   . Heart disease Mother   . Diabetes Father   . Heart disease Father   . Hypertension Maternal Grandmother   . Diabetes Maternal Grandmother     Social History   Socioeconomic History  . Marital status: Single    Spouse name: Not on file  . Number of children: Not on file  . Years of education: Not on file  . Highest education level: Not on file  Occupational History  . Not on file  Tobacco Use  . Smoking status: Current Some Day Smoker    Types: Cigarettes  . Smokeless tobacco: Never Used  Substance and Sexual Activity  . Alcohol use: Yes    Comment: occ  . Drug use: No  . Sexual activity: Yes    Partners: Male    Birth control/protection: None, Pill  Other Topics Concern  . Not on file  Social History Narrative  . Not on file   Social Determinants of Health   Financial  Resource Strain:   . Difficulty of Paying Living Expenses:   Food Insecurity:   . Worried About Programme researcher, broadcasting/film/video in the Last Year:   . Barista in the Last Year:   Transportation Needs:   . Freight forwarder (Medical):   Marland Kitchen Lack of Transportation (Non-Medical):   Physical Activity:   . Days of Exercise per Week:   . Minutes of Exercise per Session:   Stress:   . Feeling of Stress :   Social Connections:   . Frequency of Communication with Friends and Family:   . Frequency of Social Gatherings with Friends and Family:   . Attends Religious Services:   . Active Member of Clubs or Organizations:   . Attends Banker Meetings:   Marland Kitchen Marital Status:   Intimate Partner Violence:   . Fear of Current or Ex-Partner:   . Emotionally Abused:   Marland Kitchen Physically Abused:   . Sexually Abused:     Outpatient Medications Prior to Visit  Medication Sig Dispense Refill  . loratadine (CLARITIN) 10 MG tablet Take 1 tablet (10 mg total) by mouth daily. 30 tablet 11  . Norgestimate-Ethinyl Estradiol Triphasic (TRI-ESTARYLLA) 0.18/0.215/0.25 MG-35 MCG tablet Take 1 tablet by mouth daily. 84 tablet 3  . sulfamethoxazole-trimethoprim (BACTRIM DS) 800-160 MG  tablet Take 1 tablet by mouth 2 (two) times daily. 6 tablet 0   No facility-administered medications prior to visit.    No Known Allergies  ROS Review of Systems  Constitutional: Negative.   HENT: Negative.   Eyes: Negative.   Respiratory: Negative.   Cardiovascular: Negative.   Gastrointestinal: Negative.   Endocrine: Negative.   Genitourinary: Negative.   Musculoskeletal: Negative.   Skin: Negative.   Allergic/Immunologic: Negative.   Neurological: Negative.   Hematological: Negative.   Psychiatric/Behavioral: Negative.   All other systems reviewed and are negative.     Objective:    Physical Exam  Constitutional: She is oriented to person, place, and time. She appears well-developed and well-nourished. No  distress.  Cardiovascular: Normal rate, regular rhythm, normal heart sounds and intact distal pulses. Exam reveals no gallop and no friction rub.  No murmur heard. Pulmonary/Chest: Effort normal and breath sounds normal. No respiratory distress. She has no wheezes. She has no rales. She exhibits no tenderness.  Neurological: She is alert and oriented to person, place, and time. No cranial nerve deficit. She exhibits normal muscle tone. Coordination normal.  Skin: Skin is warm and dry. No rash noted. She is not diaphoretic. No erythema. No pallor.  Psychiatric: She has a normal mood and affect. Her behavior is normal. Judgment and thought content normal.  Nursing note and vitals reviewed.   BP 125/81   Pulse 76   Temp 98 F (36.7 C) (Temporal)   Resp 16   Ht 5\' 5"  (1.651 m)   Wt 215 lb 9.6 oz (97.8 kg)   LMP 02/19/2020   SpO2 100%   BMI 35.88 kg/m  Wt Readings from Last 3 Encounters:  03/10/20 215 lb 9.6 oz (97.8 kg)  03/02/20 215 lb 12.8 oz (97.9 kg)  01/30/20 216 lb 4.8 oz (98.1 kg)     Health Maintenance Due  Topic Date Due  . COVID-19 Vaccine (1) Never done    There are no preventive care reminders to display for this patient.  Lab Results  Component Value Date   TSH 2.120 12/17/2019   Lab Results  Component Value Date   WBC 5.8 03/02/2020   HGB 12.4 03/02/2020   HCT 38.0 03/02/2020   MCV 89 03/02/2020   PLT 273 12/17/2019   Lab Results  Component Value Date   NA 141 03/02/2020   K 4.4 03/02/2020   CO2 18 (L) 03/02/2020   GLUCOSE 85 03/02/2020   BUN 7 03/02/2020   CREATININE 0.79 03/02/2020   BILITOT 0.3 03/02/2020   ALKPHOS 44 03/02/2020   AST 21 03/02/2020   ALT 16 03/02/2020   PROT 7.0 03/02/2020   ALBUMIN 4.4 03/02/2020   CALCIUM 9.5 03/02/2020   ANIONGAP 8.0 03/22/2015   GFR 71.15 08/17/2015   Lab Results  Component Value Date   CHOL 236 (H) 12/17/2019   Lab Results  Component Value Date   HDL 98 12/17/2019   Lab Results  Component  Value Date   LDLCALC 108 (H) 12/17/2019   Lab Results  Component Value Date   TRIG 181 (H) 12/17/2019   Lab Results  Component Value Date   CHOLHDL 2.4 12/17/2019   Lab Results  Component Value Date   HGBA1C 5.5 03/02/2020      Assessment & Plan:   Problem List Items Addressed This Visit    None    Visit Diagnoses    Preoperative examination    -  Primary   Relevant Orders  Urinalysis   CBC with Differential/Platelet   Routine general medical examination at a health care facility       Relevant Orders   EKG 12-Lead (Completed)   CBC with Differential/Platelet      No orders of the defined types were placed in this encounter.   Follow-up: No follow-ups on file.   PLAN  Urinalysis and CBC colllected  ekg today wnl  Pending normal urinalysis and cbc, pt cleared for surgery  Patient encouraged to call clinic with any questions, comments, or concerns.  Janeece Agee, NP

## 2020-03-11 ENCOUNTER — Encounter: Payer: Self-pay | Admitting: Registered Nurse

## 2020-03-11 NOTE — Telephone Encounter (Signed)
Please Advise

## 2020-03-11 NOTE — Telephone Encounter (Signed)
Spoke with Patient to let her know that you would send the email and that I would fax it and make copies for our end. Patient would like to know if you could email labs to her as well. [email or web address] Please advise.

## 2020-03-12 NOTE — Telephone Encounter (Signed)
Pt paperwork was placed up front awaiting for pick up.

## 2020-03-13 NOTE — Telephone Encounter (Signed)
Thank you Rich!

## 2020-04-07 ENCOUNTER — Ambulatory Visit: Payer: 59 | Admitting: Registered Nurse

## 2020-04-07 ENCOUNTER — Other Ambulatory Visit: Payer: Self-pay

## 2020-04-07 ENCOUNTER — Encounter: Payer: Self-pay | Admitting: Registered Nurse

## 2020-04-07 VITALS — BP 111/72 | HR 83 | Temp 98.0°F | Ht 65.0 in | Wt 221.0 lb

## 2020-04-07 DIAGNOSIS — L299 Pruritus, unspecified: Secondary | ICD-10-CM

## 2020-04-07 MED ORDER — HYDROXYZINE HCL 10 MG PO TABS: 10.0000 mg | ORAL_TABLET | Freq: Three times a day (TID) | ORAL | 0 refills | Status: DC | PRN

## 2020-04-07 NOTE — Progress Notes (Signed)
Acute Office Visit  Subjective:    Patient ID: Elaine Martin, female    DOB: 21-Dec-1985, 34 y.o.   MRN: [id number]  Chief Complaint  Patient presents with  . post op stitch removal    lipo on back / itching and swelling   . schaffing    inner thigh area    HPI Patient is in today for suture removal  Had liposuction in [place], [place]. Went well, healing appropriately and without concern. Operation was 1 week ago, reports that she needs 4 sutures removed from her back. No drainage or symptoms of infection. They are itching as they heal, though, Benadryl not effective.  No other concerns today, feeling well overall.   Past Medical History:  Diagnosis Date  . Allergy   . Hypertension   . Medical history non-contributory     Past Surgical History:  Procedure Laterality Date  . CESAREAN SECTION    . CESAREAN SECTION N/A 03/22/2015   Procedure: REPEAT CESAREAN SECTION;  Surgeon: Kathreen Cosier, MD;  Location: [facility];  Service: Obstetrics;  Laterality: N/A;    Family History  Problem Relation Age of Onset  . Hypertension Mother   . Diabetes Mother   . Heart disease Mother   . Diabetes Father   . Heart disease Father   . Hypertension Maternal Grandmother   . Diabetes Maternal Grandmother     Social History   Socioeconomic History  . Marital status: Single    Spouse name: Not on file  . Number of children: Not on file  . Years of education: Not on file  . Highest education level: Not on file  Occupational History  . Not on file  Tobacco Use  . Smoking status: Current Some Day Smoker    Types: Cigarettes  . Smokeless tobacco: Never Used  Vaping Use  . Vaping Use: Never used  Substance and Sexual Activity  . Alcohol use: Yes    Comment: occ  . Drug use: No  . Sexual activity: Yes    Partners: Male    Birth control/protection: None, Pill  Other Topics Concern  . Not on file  Social History Narrative  . Not on file   Social Determinants of Health    Financial Resource Strain:   . Difficulty of Paying Living Expenses:   Food Insecurity:   . Worried About Programme researcher, broadcasting/film/video in the Last Year:   . Barista in the Last Year:   Transportation Needs:   . Freight forwarder (Medical):   Marland Kitchen Lack of Transportation (Non-Medical):   Physical Activity:   . Days of Exercise per Week:   . Minutes of Exercise per Session:   Stress:   . Feeling of Stress :   Social Connections:   . Frequency of Communication with Friends and Family:   . Frequency of Social Gatherings with Friends and Family:   . Attends Religious Services:   . Active Member of Clubs or Organizations:   . Attends Banker Meetings:   Marland Kitchen Marital Status:   Intimate Partner Violence:   . Fear of Current or Ex-Partner:   . Emotionally Abused:   Marland Kitchen Physically Abused:   . Sexually Abused:     Outpatient Medications Prior to Visit  Medication Sig Dispense Refill  . cephALEXin (KEFLEX) 500 MG capsule Take 500 mg by mouth every 8 (eight) hours.    Marland Kitchen loratadine (CLARITIN) 10 MG tablet Take 1 tablet (10 mg total)  by mouth daily. 30 tablet 11  . Norgestimate-Ethinyl Estradiol Triphasic (TRI-ESTARYLLA) 0.18/0.215/0.25 MG-35 MCG tablet Take 1 tablet by mouth daily. 84 tablet 3  . HYDROcodone-acetaminophen (NORCO/VICODIN) 5-325 MG tablet Take 1 tablet by mouth every 6 (six) hours as needed. (Patient not taking: Reported on 04/07/2020)    . ondansetron (ZOFRAN-ODT) 8 MG disintegrating tablet Take 8 mg by mouth every 8 (eight) hours as needed. (Patient not taking: Reported on 04/07/2020)     No facility-administered medications prior to visit.    No Known Allergies  Review of Systems  Constitutional: Negative.   HENT: Negative.   Eyes: Negative.   Respiratory: Negative.   Cardiovascular: Negative.   Gastrointestinal: Negative.   Endocrine: Negative.   Genitourinary: Negative.   Musculoskeletal: Negative.   Skin: Negative.   Allergic/Immunologic: Negative.    Neurological: Negative.   Hematological: Negative.   Psychiatric/Behavioral: Negative.   All other systems reviewed and are negative.      Objective:    Physical Exam Constitutional:      General: She is not in acute distress.    Appearance: Normal appearance. She is normal weight. She is not ill-appearing, toxic-appearing or diaphoretic.  Skin:    Capillary Refill: Capillary refill takes less than 2 seconds.       Neurological:     General: No focal deficit present.     Mental Status: She is alert and oriented to person, place, and time. Mental status is at baseline.  Psychiatric:        Mood and Affect: Mood normal.        Behavior: Behavior normal.        Thought Content: Thought content normal.        Judgment: Judgment normal.     BP 111/72   Pulse 83   Temp 98 F (36.7 C)   Ht 5\' 5"  (1.651 m)   Wt 221 lb (100.2 kg)   LMP 03/18/2020 (Approximate)   SpO2 98%   BMI 36.78 kg/m  Wt Readings from Last 3 Encounters:  04/07/20 221 lb (100.2 kg)  03/10/20 215 lb 9.6 oz (97.8 kg)  03/02/20 215 lb 12.8 oz (97.9 kg)    Health Maintenance Due  Topic Date Due  . Hepatitis C Screening  Never done  . COVID-19 Vaccine (1) Never done    There are no preventive care reminders to display for this patient.   Lab Results  Component Value Date   TSH 2.120 12/17/2019   Lab Results  Component Value Date   WBC 7.9 03/10/2020   HGB 12.7 03/10/2020   HCT 41.0 03/10/2020   MCV 91 03/10/2020   PLT 251 03/10/2020   Lab Results  Component Value Date   NA 141 03/02/2020   K 4.4 03/02/2020   CO2 18 (L) 03/02/2020   GLUCOSE 85 03/02/2020   BUN 7 03/02/2020   CREATININE 0.79 03/02/2020   BILITOT 0.3 03/02/2020   ALKPHOS 44 03/02/2020   AST 21 03/02/2020   ALT 16 03/02/2020   PROT 7.0 03/02/2020   ALBUMIN 4.4 03/02/2020   CALCIUM 9.5 03/02/2020   ANIONGAP 8.0 03/22/2015   GFR 71.15 08/17/2015   Lab Results  Component Value Date   CHOL 236 (H) 12/17/2019   Lab  Results  Component Value Date   HDL 98 12/17/2019   Lab Results  Component Value Date   LDLCALC 108 (H) 12/17/2019   Lab Results  Component Value Date   TRIG 181 (H) 12/17/2019  Lab Results  Component Value Date   CHOLHDL 2.4 12/17/2019   Lab Results  Component Value Date   HGBA1C 5.5 03/02/2020       Assessment & Plan:   Problem List Items Addressed This Visit    None    Visit Diagnoses    Itching    -  Primary   Relevant Medications   hydrOXYzine (ATARAX/VISTARIL) 10 MG tablet       Meds ordered this encounter  Medications  . hydrOXYzine (ATARAX/VISTARIL) 10 MG tablet    Sig: Take 1 tablet (10 mg total) by mouth 3 (three) times daily as needed.    Dispense:  30 tablet    Refill:  0    Order Specific Question:   Supervising Provider    AnswerMyles Lipps [[id number]]   PLAN  Sutures removed without concern.   Steri strips placed on one area that was not well approximated  Return prn  Hydroxyzine for itching 10mg  PO tid PRN  Patient encouraged to call clinic with any questions, comments, or concerns.   , NP

## 2020-04-07 NOTE — Patient Instructions (Signed)
° ° ° °  If you have lab work done today you will be contacted with your lab results within the next 2 weeks.  If you have not heard from us then please contact us. The fastest way to get your results is to register for My Chart. ° ° °IF you received an x-ray today, you will receive an invoice from [facility]. Please contact [facility] at [phone] with questions or concerns regarding your invoice.  ° °IF you received labwork today, you will receive an invoice from [facility]. Please contact [facility] at [phone] with questions or concerns regarding your invoice.  ° °Our billing staff will not be able to assist you with questions regarding bills from these companies. ° °You will be contacted with the lab results as soon as they are available. The fastest way to get your results is to activate your My Chart account. Instructions are located on the last page of this paperwork. If you have not heard from us regarding the results in 2 weeks, please contact this office. °  ° ° ° °

## 2020-10-19 ENCOUNTER — Other Ambulatory Visit: Payer: Self-pay | Admitting: Obstetrics

## 2020-10-19 DIAGNOSIS — Z3041 Encounter for surveillance of contraceptive pills: Secondary | ICD-10-CM

## 2020-11-04 ENCOUNTER — Other Ambulatory Visit: Payer: Self-pay | Admitting: Obstetrics

## 2020-11-04 DIAGNOSIS — Z3041 Encounter for surveillance of contraceptive pills: Secondary | ICD-10-CM

## 2020-11-05 ENCOUNTER — Other Ambulatory Visit: Payer: Self-pay | Admitting: Obstetrics

## 2020-11-05 DIAGNOSIS — Z3041 Encounter for surveillance of contraceptive pills: Secondary | ICD-10-CM

## 2020-11-05 MED ORDER — NORGESTIM-ETH ESTRAD TRIPHASIC 0.18/0.215/0.25 MG-35 MCG PO TABS: 1.0000 | ORAL_TABLET | Freq: Every day | ORAL | 3 refills | Status: DC

## 2021-03-23 ENCOUNTER — Other Ambulatory Visit: Payer: Self-pay | Admitting: Obstetrics

## 2021-03-23 DIAGNOSIS — Z3041 Encounter for surveillance of contraceptive pills: Secondary | ICD-10-CM

## 2021-03-30 ENCOUNTER — Other Ambulatory Visit: Payer: Self-pay

## 2021-03-30 DIAGNOSIS — Z3041 Encounter for surveillance of contraceptive pills: Secondary | ICD-10-CM

## 2021-03-30 MED ORDER — NORGESTIM-ETH ESTRAD TRIPHASIC 0.18/0.215/0.25 MG-35 MCG PO TABS: 1.0000 | ORAL_TABLET | Freq: Every day | ORAL | 0 refills | Status: DC

## 2021-03-31 ENCOUNTER — Other Ambulatory Visit: Payer: Self-pay | Admitting: Obstetrics

## 2021-03-31 DIAGNOSIS — Z3041 Encounter for surveillance of contraceptive pills: Secondary | ICD-10-CM

## 2021-04-01 ENCOUNTER — Other Ambulatory Visit: Payer: Self-pay | Admitting: Obstetrics

## 2021-06-22 ENCOUNTER — Encounter: Payer: Self-pay | Admitting: Registered Nurse

## 2021-06-23 ENCOUNTER — Ambulatory Visit: Payer: 59 | Admitting: Registered Nurse

## 2021-07-01 ENCOUNTER — Ambulatory Visit: Payer: 59 | Admitting: Registered Nurse

## 2022-01-05 ENCOUNTER — Other Ambulatory Visit: Payer: Self-pay | Admitting: Obstetrics

## 2022-01-05 DIAGNOSIS — Z3041 Encounter for surveillance of contraceptive pills: Secondary | ICD-10-CM

## 2022-01-13 ENCOUNTER — Other Ambulatory Visit: Payer: Self-pay

## 2022-01-13 ENCOUNTER — Other Ambulatory Visit (HOSPITAL_COMMUNITY)
Admission: RE | Admit: 2022-01-13 | Discharge: 2022-01-13 | Disposition: A | Discharge status: Home / Self Care | Payer: 59 | Source: Ambulatory Visit | Attending: Obstetrics | Admitting: Obstetrics

## 2022-01-13 ENCOUNTER — Encounter: Payer: Self-pay | Admitting: Obstetrics

## 2022-01-13 ENCOUNTER — Ambulatory Visit (INDEPENDENT_AMBULATORY_CARE_PROVIDER_SITE_OTHER): Payer: 59 | Admitting: Obstetrics

## 2022-01-13 VITALS — BP 140/84 | HR 87 | Ht 64.0 in | Wt 216.6 lb

## 2022-01-13 DIAGNOSIS — Z01419 Encounter for gynecological examination (general) (routine) without abnormal findings: Secondary | ICD-10-CM | POA: Insufficient documentation

## 2022-01-13 DIAGNOSIS — Z3041 Encounter for surveillance of contraceptive pills: Secondary | ICD-10-CM

## 2022-01-13 DIAGNOSIS — N898 Other specified noninflammatory disorders of vagina: Secondary | ICD-10-CM | POA: Insufficient documentation

## 2022-01-13 DIAGNOSIS — E669 Obesity, unspecified: Secondary | ICD-10-CM | POA: Diagnosis not present

## 2022-01-13 DIAGNOSIS — J302 Other seasonal allergic rhinitis: Secondary | ICD-10-CM

## 2022-01-13 MED ORDER — NORGESTIM-ETH ESTRAD TRIPHASIC 0.18/0.215/0.25 MG-35 MCG PO TABS: 1.0000 | ORAL_TABLET | Freq: Every day | ORAL | 3 refills | Status: DC

## 2022-01-13 MED ORDER — LORATADINE 10 MG PO TABS: 10.0000 mg | ORAL_TABLET | Freq: Every day | ORAL | 11 refills | Status: DC

## 2022-01-13 NOTE — Progress Notes (Signed)
RGYN in office annual. Pt requesting STI testing. She does not have any concerns today.  ? ?Pt needs BC refill.  ?

## 2022-01-13 NOTE — Progress Notes (Signed)
? ?Subjective: ? ? ?  ?  ? Elaine Martin is a 36 y.o. female here for a routine exam.  Current complaints: Vaginal discharge.   ? ?Personal health questionnaire:  ?Is patient Ashkenazi Jewish, have a family history of breast and/or ovarian cancer: no ?Is there a family history of uterine cancer diagnosed at age < 56, gastrointestinal cancer, urinary tract cancer, family member who is a Personnel officer syndrome-associated carrier: no ?Is the patient overweight and hypertensive, family history of diabetes, personal history of gestational diabetes, preeclampsia or PCOS: yes ?Is patient over 65, have PCOS,  family history of premature CHD under age 24, diabetes, smoke, have hypertension or peripheral artery disease:  no ?At any time, has a partner hit, kicked or otherwise hurt or frightened you?: no ?Over the past 2 weeks, have you felt down, depressed or hopeless?: no ?Over the past 2 weeks, have you felt little interest or pleasure in doing things?:no ? ? ?Gynecologic History ?No LMP recorded. ?Contraception: OCP (estrogen/progesterone) ?Last Pap: 01-30-2020. Results were: normal ?Last mammogram: n/a. Results were: n/a ? ?Obstetric History ?OB History  ?Gravida Para Term Preterm AB Living  ?3 2 2   1 2   ?SAB IAB Ectopic Multiple Live Births  ?      0 2  ?  ?# Outcome Date GA Lbr Len/2nd Weight Sex Delivery Anes PTL Lv  ?3 Term 03/23/15 [redacted]w[redacted]d  5 lb 14.9 oz (2.69 kg) M CS-LTranv Spinal  LIV  ?2 Term 10/24/06 [redacted]w[redacted]d  6 lb 2 oz (2.778 kg) M CS-LTranv Spinal N LIV  ?1 AB 2004          ? ? ?Past Medical History:  ?Diagnosis Date  ? Allergy   ? Hypertension   ? Medical history non-contributory   ?  ?Past Surgical History:  ?Procedure Laterality Date  ? CESAREAN SECTION    ? CESAREAN SECTION N/A 03/22/2015  ? Procedure: REPEAT CESAREAN SECTION;  Surgeon: 03/24/2015, MD;  Location: [facility];  Service: Obstetrics;  Laterality: N/A;  ?  ? ?Current Outpatient Medications:  ?  cephALEXin (KEFLEX) 500 MG capsule, Take 500 mg by mouth  every 8 (eight) hours., Disp: , Rfl:  ?  HYDROcodone-acetaminophen (NORCO/VICODIN) 5-325 MG tablet, Take 1 tablet by mouth every 6 (six) hours as needed. (Patient not taking: Reported on 04/07/2020), Disp: , Rfl:  ?  hydrOXYzine (ATARAX/VISTARIL) 10 MG tablet, Take 1 tablet (10 mg total) by mouth 3 (three) times daily as needed., Disp: 30 tablet, Rfl: 0 ?  loratadine (CLARITIN) 10 MG tablet, Take 1 tablet (10 mg total) by mouth daily., Disp: 30 tablet, Rfl: 11 ?  Norgestimate-Ethinyl Estradiol Triphasic (TRI-ESTARYLLA) 0.18/0.215/0.25 MG-35 MCG tablet, Take 1 tablet by mouth daily., Disp: 84 tablet, Rfl: 3 ?  ondansetron (ZOFRAN-ODT) 8 MG disintegrating tablet, Take 8 mg by mouth every 8 (eight) hours as needed. (Patient not taking: Reported on 04/07/2020), Disp: , Rfl:  ?No Known Allergies  ?Social History  ? ?Tobacco Use  ? Smoking status: Some Days  ?  Types: Cigarettes  ? Smokeless tobacco: Never  ?Substance Use Topics  ? Alcohol use: Yes  ?  Comment: occ  ?  ?Family History  ?Problem Relation Age of Onset  ? Hypertension Mother   ? Diabetes Mother   ? Heart disease Mother   ? Diabetes Father   ? Heart disease Father   ? Hypertension Maternal Grandmother   ? Diabetes Maternal Grandmother   ?  ? ? ?Review of Systems ? ?  Constitutional: negative for fatigue and weight loss ?Respiratory: negative for cough and wheezing ?Cardiovascular: negative for chest pain, fatigue and palpitations ?Gastrointestinal: negative for abdominal pain and change in bowel habits ?Musculoskeletal:negative for myalgias ?Neurological: negative for gait problems and tremors ?Behavioral/Psych: negative for abusive relationship, depression ?Endocrine: negative for temperature intolerance    ?Genitourinary: positive for vaginal discharge.  negative for abnormal menstrual periods, genital lesions, hot flashes, sexual problems  ?Integument/breast: negative for breast lump, breast tenderness, nipple discharge and skin lesion(s) ? ?  ?Objective:  ? ?     ?BP 140/84   Pulse 87   Ht 5\' 4"  (1.626 m)   Wt 216 lb 9.6 oz (98.2 kg)   BMI 37.18 kg/m?  ?General:   Alert and no distress  ?Skin:   no rash or abnormalities  ?Lungs:   clear to auscultation bilaterally  ?Heart:   regular rate and rhythm, S1, S2 normal, no murmur, click, rub or gallop  ?Breasts:   normal without suspicious masses, skin or nipple changes or axillary nodes  ?Abdomen:  normal findings: no organomegaly, soft, non-tender and no hernia  ?Pelvis:  External genitalia: normal general appearance ?Urinary system: urethral meatus normal and bladder without fullness, nontender ?Vaginal: normal without tenderness, induration or masses ?Cervix: normal appearance ?Adnexa: normal bimanual exam ?Uterus: anteverted and non-tender, normal size  ? ?Lab Review ?Urine pregnancy test ?Labs reviewed yes ?Radiologic studies reviewed no ? ?I have spent a total of 20 minutes of face-to-face time, excluding clinical staff time, reviewing notes and preparing to see patient, ordering tests and/or medications, and counseling the patient.  ? ?Assessment:  ? ? 1. Encounter for gynecological examination with Papanicolaou smear of cervix ?Rx: ?- Cytology - PAP( [facility]) ? ?2. Vaginal discharge ?Rx: ?- Cervicovaginal ancillary only( [place]) ? ?3. Encounter for surveillance of contraceptive pills ?Rx: ?- Norgestimate-Ethinyl Estradiol Triphasic (TRI-ESTARYLLA) 0.18/0.215/0.25 MG-35 MCG tablet; Take 1 tablet by mouth daily.  Dispense: 84 tablet; Refill: 3 ? ?4. Obesity (BMI 35.0-39.9 without comorbidity) ?- weight reduction with the aid of dietary changes, exercise and behavioral modification rfecommended ? ?5. Seasonal allergies ?Rx: ?- loratadine (CLARITIN) 10 MG tablet; Take 1 tablet (10 mg total) by mouth daily.  Dispense: 30 tablet; Refill: 11  ?  ? ?Plan:  ? ? Education reviewed: calcium supplements, depression evaluation, low fat, low cholesterol diet, safe sex/STD prevention, self breast exams, smoking  cessation, and weight bearing exercise. ?Contraception: OCP (estrogen/progesterone). ?Follow up in: 1 year.  ? ?Meds ordered this encounter  ?Medications  ? Norgestimate-Ethinyl Estradiol Triphasic (TRI-ESTARYLLA) 0.18/0.215/0.25 MG-35 MCG tablet  ?  Sig: Take 1 tablet by mouth daily.  ?  Dispense:  84 tablet  ?  Refill:  3  ?  Requesting 1 year supply  ? loratadine (CLARITIN) 10 MG tablet  ?  Sig: Take 1 tablet (10 mg total) by mouth daily.  ?  Dispense:  30 tablet  ?  Refill:  11  ? ? ? ? 11-21-1982, MD ?01/13/2022 4:24 PM  ?

## 2022-01-14 LAB — CERVICOVAGINAL ANCILLARY ONLY
Bacterial Vaginitis (gardnerella): POSITIVE — AB
Candida Glabrata: NEGATIVE
Candida Vaginitis: NEGATIVE
Chlamydia: NEGATIVE
Comment: NEGATIVE
Comment: NEGATIVE
Comment: NEGATIVE
Comment: NEGATIVE
Comment: NEGATIVE
Comment: NORMAL
Neisseria Gonorrhea: NEGATIVE
Trichomonas: NEGATIVE

## 2022-01-15 ENCOUNTER — Other Ambulatory Visit: Payer: Self-pay | Admitting: Obstetrics

## 2022-01-15 DIAGNOSIS — B9689 Other specified bacterial agents as the cause of diseases classified elsewhere: Secondary | ICD-10-CM

## 2022-01-15 MED ORDER — METRONIDAZOLE 500 MG PO TABS: 500.0000 mg | ORAL_TABLET | Freq: Two times a day (BID) | ORAL | 2 refills | Status: DC

## 2022-01-20 LAB — CYTOLOGY - PAP
Comment: NEGATIVE
Comment: NEGATIVE
Comment: NEGATIVE
Diagnosis: UNDETERMINED — AB
HPV 16: NEGATIVE
HPV 18 / 45: NEGATIVE
High risk HPV: POSITIVE — AB

## 2022-03-09 ENCOUNTER — Other Ambulatory Visit (HOSPITAL_COMMUNITY): Payer: Self-pay

## 2022-04-18 ENCOUNTER — Other Ambulatory Visit: Payer: Self-pay | Admitting: Obstetrics

## 2022-04-18 DIAGNOSIS — Z3041 Encounter for surveillance of contraceptive pills: Secondary | ICD-10-CM

## 2022-08-05 ENCOUNTER — Other Ambulatory Visit: Payer: Self-pay | Admitting: Obstetrics

## 2022-08-05 DIAGNOSIS — B9689 Other specified bacterial agents as the cause of diseases classified elsewhere: Secondary | ICD-10-CM

## 2023-01-21 ENCOUNTER — Other Ambulatory Visit: Payer: Self-pay | Admitting: Obstetrics

## 2023-01-21 DIAGNOSIS — Z3041 Encounter for surveillance of contraceptive pills: Secondary | ICD-10-CM

## 2023-02-19 ENCOUNTER — Other Ambulatory Visit: Payer: Self-pay | Admitting: Obstetrics

## 2023-02-19 DIAGNOSIS — J302 Other seasonal allergic rhinitis: Secondary | ICD-10-CM

## 2023-03-17 ENCOUNTER — Other Ambulatory Visit: Payer: Self-pay | Admitting: Obstetrics

## 2023-03-17 DIAGNOSIS — Z3041 Encounter for surveillance of contraceptive pills: Secondary | ICD-10-CM

## 2023-04-19 ENCOUNTER — Other Ambulatory Visit (HOSPITAL_COMMUNITY)
Admission: RE | Admit: 2023-04-19 | Discharge: 2023-04-19 | Disposition: A | Discharge status: Home / Self Care | Payer: 59 | Source: Ambulatory Visit | Attending: Obstetrics | Admitting: Obstetrics

## 2023-04-19 ENCOUNTER — Ambulatory Visit (INDEPENDENT_AMBULATORY_CARE_PROVIDER_SITE_OTHER): Payer: 59 | Admitting: Obstetrics

## 2023-04-19 ENCOUNTER — Encounter: Payer: Self-pay | Admitting: Obstetrics

## 2023-04-19 VITALS — BP 123/76 | HR 75 | Ht 64.0 in | Wt 203.0 lb

## 2023-04-19 DIAGNOSIS — Z3041 Encounter for surveillance of contraceptive pills: Secondary | ICD-10-CM | POA: Diagnosis not present

## 2023-04-19 DIAGNOSIS — Z01419 Encounter for gynecological examination (general) (routine) without abnormal findings: Secondary | ICD-10-CM

## 2023-04-19 DIAGNOSIS — N898 Other specified noninflammatory disorders of vagina: Secondary | ICD-10-CM | POA: Insufficient documentation

## 2023-04-19 DIAGNOSIS — F17210 Nicotine dependence, cigarettes, uncomplicated: Secondary | ICD-10-CM

## 2023-04-19 MED ORDER — NORGESTIM-ETH ESTRAD TRIPHASIC 0.18/0.215/0.25 MG-35 MCG PO TABS: ORAL_TABLET | ORAL | 12 refills | Status: DC

## 2023-04-19 NOTE — Progress Notes (Signed)
36 y.o GYN presents for AEX/PAP/STD screening. 

## 2023-04-19 NOTE — Progress Notes (Unsigned)
Subjective:        Elaine Martin is a 37 y.o. female here for a routine exam.  Current complaints: Vaginal discharge.    Personal health questionnaire:  Is patient Ashkenazi Jewish, have a family history of breast and/or ovarian cancer: no Is there a family history of uterine cancer diagnosed at age < 38, gastrointestinal cancer, urinary tract cancer, family member who is a Personnel officer syndrome-associated carrier: no Is the patient overweight and hypertensive, family history of diabetes, personal history of gestational diabetes, preeclampsia or PCOS: no Is patient over 71, have PCOS,  family history of premature CHD under age 37, diabetes, smoke, have hypertension or peripheral artery disease:  no At any time, has a partner hit, kicked or otherwise hurt or frightened you?: no Over the past 2 weeks, have you felt down, depressed or hopeless?: no Over the past 2 weeks, have you felt little interest or pleasure in doing things?:no   Gynecologic History Patient's last menstrual period was 04/19/2023 (exact date). Contraception: OCP (estrogen/progesterone) Last Pap: 2023. Results were: abnormal Last mammogram: n/a. Results were: n/a  Obstetric History OB History  Gravida Para Term Preterm AB Living  3 2 2   1 2   SAB IAB Ectopic Multiple Live Births        0 2    # Outcome Date GA Lbr Len/2nd Weight Sex Delivery Anes PTL Lv  3 Term 03/23/15 [redacted]w[redacted]d  5 lb 14.9 oz (2.69 kg) M CS-LTranv Spinal  LIV  2 Term 10/24/06 [redacted]w[redacted]d  6 lb 2 oz (2.778 kg) M CS-LTranv Spinal N LIV  1 AB 2004            Past Medical History:  Diagnosis Date   Allergy    Hypertension    Medical history non-contributory     Past Surgical History:  Procedure Laterality Date   CESAREAN SECTION     CESAREAN SECTION N/A 03/22/2015   Procedure: REPEAT CESAREAN SECTION;  Surgeon: Kathreen Cosier, MD;  Location: [facility];  Service: Obstetrics;  Laterality: N/A;     Current Outpatient Medications:    cephALEXin (KEFLEX)  500 MG capsule, Take 500 mg by mouth every 8 (eight) hours., Disp: , Rfl:    HYDROcodone-acetaminophen (NORCO/VICODIN) 5-325 MG tablet, Take 1 tablet by mouth every 6 (six) hours as needed. (Patient not taking: Reported on 04/07/2020), Disp: , Rfl:    hydrOXYzine (ATARAX/VISTARIL) 10 MG tablet, Take 1 tablet (10 mg total) by mouth 3 (three) times daily as needed., Disp: 30 tablet, Rfl: 0   loratadine (EQ ALL DAY ALLERGY RELIEF) 10 MG tablet, Take 1 tablet by mouth once daily, Disp: 30 tablet, Rfl: 11   metroNIDAZOLE (FLAGYL) 500 MG tablet, Take 1 tablet by mouth twice daily, Disp: 14 tablet, Rfl: 2   Norgestimate-Ethinyl Estradiol Triphasic (TRI-SPRINTEC) 0.18/0.215/0.25 MG-35 MCG tablet, TAKE 1 TABLET BY MOUTH ONCE DAILY *APPOINTMENT NEEDED FOR ADDITIONAL REFILLS*, Disp: 28 tablet, Rfl: 12   ondansetron (ZOFRAN-ODT) 8 MG disintegrating tablet, Take 8 mg by mouth every 8 (eight) hours as needed. (Patient not taking: Reported on 04/07/2020), Disp: , Rfl:  No Known Allergies  Social History   Tobacco Use   Smoking status: Some Days    Types: Cigarettes   Smokeless tobacco: Never  Substance Use Topics   Alcohol use: Yes    Comment: occ    Family History  Problem Relation Age of Onset   Hypertension Mother    Diabetes Mother    Heart disease Mother  Diabetes Father    Heart disease Father    Hypertension Maternal Grandmother    Diabetes Maternal Grandmother       Review of Systems  Constitutional: negative for fatigue and weight loss Respiratory: negative for cough and wheezing Cardiovascular: negative for chest pain, fatigue and palpitations Gastrointestinal: negative for abdominal pain and change in bowel habits Musculoskeletal:negative for myalgias Neurological: negative for gait problems and tremors Behavioral/Psych: negative for abusive relationship, depression Endocrine: negative for temperature intolerance    Genitourinary: positive for vaginal discharge.  negative for  abnormal menstrual periods, genital lesions, hot flashes, sexual problems  Integument/breast: negative for breast lump, breast tenderness, nipple discharge and skin lesion(s)    Objective:       BP 123/76   Pulse 75   Ht 5\' 4"  (1.626 m)   Wt 203 lb (92.1 kg)   LMP 04/19/2023 (Exact Date)   BMI 34.84 kg/m  General:   alert  Skin:   no rash or abnormalities  Lungs:   clear to auscultation bilaterally  Heart:   regular rate and rhythm, S1, S2 normal, no murmur, click, rub or gallop  Breasts:   normal without suspicious masses, skin or nipple changes or axillary nodes  Abdomen:  normal findings: no organomegaly, soft, non-tender and no hernia  Pelvis:  External genitalia: normal general appearance Urinary system: urethral meatus normal and bladder without fullness, nontender Vaginal: normal without tenderness, induration or masses Cervix: normal appearance Adnexa: normal bimanual exam Uterus: anteverted and non-tender, normal size   Lab Review Urine pregnancy test Labs reviewed yes Radiologic studies reviewed no  I have spent a total of 20 minutes of face-to-face time, excluding clinical staff time, reviewing notes and preparing to see patient, ordering tests and/or medications, and counseling the patient.   Assessment:    1. Encounter for gynecological examination with Papanicolaou smear of cervix )Rx: - Cytology - PAP( [place])  2. Vaginal discharge Rx: - Cervicovaginal ancillary only( [place])  3. Encounter for surveillance of contraceptive pills Rx: - Norgestimate-Ethinyl Estradiol Triphasic (TRI-SPRINTEC) 0.18/0.215/0.25 MG-35 MCG tablet; TAKE 1 TABLET BY MOUTH ONCE DAILY *APPOINTMENT NEEDED FOR ADDITIONAL REFILLS*  Dispense: 28 tablet; Refill: 12  4. Tobacco dependence due to cigarettes - cessation recommended     Plan:    Education reviewed: calcium supplements, depression evaluation, low fat, low cholesterol diet, safe sex/STD prevention, self breast  exams, smoking cessation, and weight bearing exercise. Contraception: OCP (estrogen/progesterone). Follow up in: 1 year.   Meds ordered this encounter  Medications   Norgestimate-Ethinyl Estradiol Triphasic (TRI-SPRINTEC) 0.18/0.215/0.25 MG-35 MCG tablet    Sig: TAKE 1 TABLET BY MOUTH ONCE DAILY *APPOINTMENT NEEDED FOR ADDITIONAL REFILLS*    Dispense:  28 tablet    Refill:  12    Patient needs to schedule AEX for additional refills    Brock Bad, MD 04/19/2023 4:52 PM

## 2023-04-21 LAB — CERVICOVAGINAL ANCILLARY ONLY
Bacterial Vaginitis (gardnerella): POSITIVE — AB
Candida Glabrata: NEGATIVE
Candida Vaginitis: NEGATIVE
Chlamydia: POSITIVE — AB
Comment: NEGATIVE
Comment: NEGATIVE
Comment: NEGATIVE
Comment: NEGATIVE
Comment: NEGATIVE
Comment: NORMAL
Neisseria Gonorrhea: NEGATIVE
Trichomonas: NEGATIVE

## 2023-04-25 ENCOUNTER — Other Ambulatory Visit: Payer: Self-pay | Admitting: Emergency Medicine

## 2023-04-25 ENCOUNTER — Encounter: Payer: Self-pay | Admitting: Obstetrics

## 2023-04-25 LAB — CYTOLOGY - PAP
Comment: NEGATIVE
Diagnosis: NEGATIVE
High risk HPV: NEGATIVE

## 2023-04-25 MED ORDER — METRONIDAZOLE 500 MG PO TABS: 500.0000 mg | ORAL_TABLET | Freq: Two times a day (BID) | ORAL | 0 refills | Status: DC

## 2023-04-25 MED ORDER — DOXYCYCLINE HYCLATE 100 MG PO CAPS: 100.0000 mg | ORAL_CAPSULE | Freq: Two times a day (BID) | ORAL | 0 refills | Status: DC

## 2023-04-25 NOTE — Progress Notes (Signed)
Rx Sent to pharmacy for Chlamydia, BV

## 2023-09-26 ENCOUNTER — Other Ambulatory Visit: Payer: Self-pay | Admitting: Obstetrics & Gynecology

## 2023-09-27 ENCOUNTER — Other Ambulatory Visit: Payer: Self-pay | Admitting: Obstetrics & Gynecology

## 2023-09-27 ENCOUNTER — Encounter: Payer: Self-pay | Admitting: *Deleted

## 2023-09-27 MED ORDER — METRONIDAZOLE 500 MG PO TABS: 500.0000 mg | ORAL_TABLET | Freq: Two times a day (BID) | ORAL | 0 refills | Status: DC

## 2023-12-03 ENCOUNTER — Other Ambulatory Visit: Payer: Self-pay | Admitting: Obstetrics & Gynecology

## 2023-12-11 ENCOUNTER — Other Ambulatory Visit: Payer: Self-pay

## 2023-12-11 MED ORDER — METRONIDAZOLE 500 MG PO TABS: 500.0000 mg | ORAL_TABLET | Freq: Two times a day (BID) | ORAL | 0 refills | Status: DC

## 2023-12-11 NOTE — Progress Notes (Signed)
Flagyl RX sent in per protocol. Refill requested by pharmacy on behalf of pt.

## 2024-04-09 ENCOUNTER — Other Ambulatory Visit: Payer: Self-pay | Admitting: Obstetrics

## 2024-04-09 DIAGNOSIS — Z3041 Encounter for surveillance of contraceptive pills: Secondary | ICD-10-CM

## 2024-04-10 ENCOUNTER — Other Ambulatory Visit: Payer: Self-pay

## 2024-04-10 DIAGNOSIS — Z3041 Encounter for surveillance of contraceptive pills: Secondary | ICD-10-CM

## 2024-04-10 MED ORDER — NORGESTIM-ETH ESTRAD TRIPHASIC 0.18/0.215/0.25 MG-35 MCG PO TABS
ORAL_TABLET | ORAL | 0 refills | Status: DC
Start: 2024-04-10 — End: 2024-05-09

## 2024-04-10 NOTE — Progress Notes (Signed)
 BC refill sent, pt needs annual appt

## 2024-05-08 ENCOUNTER — Other Ambulatory Visit: Payer: Self-pay | Admitting: Obstetrics

## 2024-05-08 DIAGNOSIS — Z3041 Encounter for surveillance of contraceptive pills: Secondary | ICD-10-CM

## 2024-05-09 ENCOUNTER — Other Ambulatory Visit: Payer: Self-pay

## 2024-05-09 DIAGNOSIS — Z3041 Encounter for surveillance of contraceptive pills: Secondary | ICD-10-CM

## 2024-05-09 MED ORDER — NORGESTIM-ETH ESTRAD TRIPHASIC 0.18/0.215/0.25 MG-35 MCG PO TABS: ORAL_TABLET | ORAL | 0 refills | Status: DC

## 2024-05-17 ENCOUNTER — Ambulatory Visit: Admitting: Obstetrics

## 2024-05-29 ENCOUNTER — Ambulatory Visit: Admitting: Obstetrics

## 2024-05-29 ENCOUNTER — Other Ambulatory Visit (HOSPITAL_COMMUNITY)
Admission: RE | Admit: 2024-05-29 | Discharge: 2024-05-29 | Disposition: A | Discharge status: Home / Self Care | Source: Ambulatory Visit | Attending: Obstetrics | Admitting: Obstetrics

## 2024-05-29 ENCOUNTER — Encounter: Payer: Self-pay | Admitting: Obstetrics

## 2024-05-29 VITALS — BP 154/86 | HR 68 | Ht 64.0 in | Wt 213.0 lb

## 2024-05-29 DIAGNOSIS — N760 Acute vaginitis: Secondary | ICD-10-CM | POA: Diagnosis not present

## 2024-05-29 DIAGNOSIS — N898 Other specified noninflammatory disorders of vagina: Secondary | ICD-10-CM | POA: Insufficient documentation

## 2024-05-29 DIAGNOSIS — Z01419 Encounter for gynecological examination (general) (routine) without abnormal findings: Secondary | ICD-10-CM | POA: Diagnosis present

## 2024-05-29 DIAGNOSIS — E669 Obesity, unspecified: Secondary | ICD-10-CM

## 2024-05-29 DIAGNOSIS — B9689 Other specified bacterial agents as the cause of diseases classified elsewhere: Secondary | ICD-10-CM

## 2024-05-29 DIAGNOSIS — Z3041 Encounter for surveillance of contraceptive pills: Secondary | ICD-10-CM

## 2024-05-29 DIAGNOSIS — F17210 Nicotine dependence, cigarettes, uncomplicated: Secondary | ICD-10-CM

## 2024-05-29 MED ORDER — METRONIDAZOLE 0.75 % VA GEL: 1.0000 | Freq: Two times a day (BID) | VAGINAL | 6 refills | Status: DC

## 2024-05-29 NOTE — Progress Notes (Signed)
 Subjective:        Elaine Martin is a 38 y.o. female here for a routine exam.  Current complaints: Vaginal discharge.    Personal health questionnaire:  Is patient Ashkenazi Jewish, have a family history of breast and/or ovarian cancer: no Is there a family history of uterine cancer diagnosed at age < 20, gastrointestinal cancer, urinary tract cancer, family member who is a Personnel officer syndrome-associated carrier: no Is the patient overweight and hypertensive, family history of diabetes, personal history of gestational diabetes, preeclampsia or PCOS: no Is patient over 71, have PCOS,  family history of premature CHD under age 74, diabetes, smoke, have hypertension or peripheral artery disease:  no At any time, has a partner hit, kicked or otherwise hurt or frightened you?: no Over the past 2 weeks, have you felt down, depressed or hopeless?: no Over the past 2 weeks, have you felt little interest or pleasure in doing things?:no   Gynecologic History Patient's last menstrual period was 05/08/2024 (exact date). Contraception: OCP (estrogen/progesterone) Last Pap: 04/19/2023. Results were: normal Last mammogram: n/a. Results were: n/a  Obstetric History OB History  Gravida Para Term Preterm AB Living  3 2 2  1 2   SAB IAB Ectopic Multiple Live Births     0 2    # Outcome Date GA Lbr Len/2nd Weight Sex Type Anes PTL Lv  3 Term 03/23/15 [redacted]w[redacted]d  5 lb 14.9 oz (2.69 kg) M CS-LTranv Spinal  LIV  2 Term 10/24/06 [redacted]w[redacted]d  6 lb 2 oz (2.778 kg) M CS-LTranv Spinal N LIV  1 AB 2004            Past Medical History:  Diagnosis Date   Allergy    Hypertension    Medical history non-contributory     Past Surgical History:  Procedure Laterality Date   CESAREAN SECTION     CESAREAN SECTION N/A 03/22/2015   Procedure: REPEAT CESAREAN SECTION;  Surgeon: Aida DELENA Na, MD;  Location: [facility];  Service: Obstetrics;  Laterality: N/A;     Current Outpatient Medications:    loratadine  (EQ ALL DAY  ALLERGY RELIEF) 10 MG tablet, Take 1 tablet by mouth once daily, Disp: 30 tablet, Rfl: 11   metroNIDAZOLE  (METROGEL ) 0.75 % vaginal gel, Place 1 Applicatorful vaginally 2 (two) times daily. Use monthly twice a day for 5 days after the period ends, for 6 months., Disp: 70 g, Rfl: 6   Norgestimate-Ethinyl Estradiol Triphasic (TRI-SPRINTEC) 0.18/0.215/0.25 MG-35 MCG tablet, TAKE 1 TABLET BY MOUTH ONCE DAILY *APPOINTMENT NEEDED FOR ADDITIONAL REFILLS*, Disp: 28 tablet, Rfl: 0   HYDROcodone -acetaminophen  (NORCO/VICODIN) 5-325 MG tablet, Take 1 tablet by mouth every 6 (six) hours as needed. (Patient not taking: Reported on 04/07/2020), Disp: , Rfl:    hydrOXYzine  (ATARAX /VISTARIL ) 10 MG tablet, Take 1 tablet (10 mg total) by mouth 3 (three) times daily as needed., Disp: 30 tablet, Rfl: 0   metroNIDAZOLE  (FLAGYL ) 500 MG tablet, Take 1 tablet (500 mg total) by mouth 2 (two) times daily. (Patient not taking: Reported on 05/29/2024), Disp: 14 tablet, Rfl: 0   ondansetron  (ZOFRAN -ODT) 8 MG disintegrating tablet, Take 8 mg by mouth every 8 (eight) hours as needed. (Patient not taking: Reported on 04/07/2020), Disp: , Rfl:  No Known Allergies  Social History   Tobacco Use   Smoking status: Some Days    Types: Cigarettes   Smokeless tobacco: Never  Substance Use Topics   Alcohol use: Yes    Comment: occ  Family History  Problem Relation Age of Onset   Hypertension Mother    Diabetes Mother    Heart disease Mother    Diabetes Father    Heart disease Father    Hypertension Maternal Grandmother    Diabetes Maternal Grandmother       Review of Systems  Constitutional: negative for fatigue and weight loss Respiratory: negative for cough and wheezing Cardiovascular: negative for chest pain, fatigue and palpitations Gastrointestinal: negative for abdominal pain and change in bowel habits Musculoskeletal:negative for myalgias Neurological: negative for gait problems and tremors Behavioral/Psych:  negative for abusive relationship, depression Endocrine: negative for temperature intolerance    Genitourinary: positive for vaginal discharge.  negative for abnormal menstrual periods, genital lesions, hot flashes, sexual problems  Integument/breast: negative for breast lump, breast tenderness, nipple discharge and skin lesion(s)    Objective:       BP (!) 154/86   Pulse 68   Ht 5' 4 (1.626 m)   Wt 213 lb (96.6 kg)   LMP 05/08/2024 (Exact Date)   BMI 36.56 kg/m  General:   Alert and no distress  Skin:   no rash or abnormalities  Lungs:   clear to auscultation bilaterally  Heart:   regular rate and rhythm, S1, S2 normal, no murmur, click, rub or gallop  Breasts:   normal without suspicious masses, skin or nipple changes or axillary nodes  Abdomen:  normal findings: no organomegaly, soft, non-tender and no hernia  Pelvis:  External genitalia: normal general appearance Urinary system: urethral meatus normal and bladder without fullness, nontender Vaginal: normal without tenderness, induration or masses Cervix: normal appearance Adnexa: normal bimanual exam Uterus: anteverted and non-tender, normal size   Lab Review Urine pregnancy test Labs reviewed yes Radiologic studies reviewed no  I have spent a total of 20 minutes of face-to-face time, excluding clinical staff time, reviewing notes and preparing to see patient, ordering tests and/or medications, and counseling the patient.   Assessment:    1. Encounter for gynecological examination with Papanicolaou smear of cervix (Primary) Rx: - Cytology - PAP  2. Vaginal discharge Rx: - Cervicovaginal ancillary only  3. BV (bacterial vaginosis) Rx: - metroNIDAZOLE  (METROGEL ) 0.75 % vaginal gel; Place 1 Applicatorful vaginally 2 (two) times daily. Use monthly twice a day for 5 days after the period ends, for 6 months.  Dispense: 70 g; Refill: 6  4. Encounter for surveillance of contraceptive pills - doing well  5. Obesity  (BMI 35.0-39.9 without comorbidity) - has lost ~ 30 lbs over the past year with dietary changes, exercise and behavioral modification - encouraged to continue current regimen for weight reduction  6. Tobacco dependence due to cigarettes - cessation recommended    Plan:    Education reviewed: calcium supplements, depression evaluation, low fat, low cholesterol diet, safe sex/STD prevention, self breast exams, skin cancer screening, smoking cessation, and weight bearing exercise. Contraception: OCP (estrogen/progesterone). Follow up in: 1 year.   Meds ordered this encounter  Medications   metroNIDAZOLE  (METROGEL ) 0.75 % vaginal gel    Sig: Place 1 Applicatorful vaginally 2 (two) times daily. Use monthly twice a day for 5 days after the period ends, for 6 months.    Dispense:  70 g    Refill:  6     CARLIN RONAL CENTERS, MD, FACOG Attending Obstetrician & Gynecologist, [facility] for [facility], [place] 05/29/2024

## 2024-05-29 NOTE — Progress Notes (Signed)
 Hx discharge but menses due next few days. Wants STI testing. Just the swab. Plans to continue same Ocs.

## 2024-05-30 ENCOUNTER — Ambulatory Visit: Payer: Self-pay | Admitting: Obstetrics

## 2024-05-30 LAB — CERVICOVAGINAL ANCILLARY ONLY
Bacterial Vaginitis (gardnerella): POSITIVE — AB
Candida Glabrata: NEGATIVE
Candida Vaginitis: NEGATIVE
Chlamydia: NEGATIVE
Comment: NEGATIVE
Comment: NEGATIVE
Comment: NEGATIVE
Comment: NEGATIVE
Comment: NEGATIVE
Comment: NORMAL
Neisseria Gonorrhea: NEGATIVE
Trichomonas: NEGATIVE

## 2024-06-04 LAB — CYTOLOGY - PAP
Comment: NEGATIVE
Diagnosis: NEGATIVE
Diagnosis: REACTIVE
High risk HPV: NEGATIVE

## 2024-06-05 ENCOUNTER — Other Ambulatory Visit: Payer: Self-pay | Admitting: Obstetrics and Gynecology

## 2024-06-05 DIAGNOSIS — Z3041 Encounter for surveillance of contraceptive pills: Secondary | ICD-10-CM

## 2024-06-09 ENCOUNTER — Other Ambulatory Visit: Payer: Self-pay | Admitting: Obstetrics and Gynecology

## 2024-06-09 DIAGNOSIS — Z3041 Encounter for surveillance of contraceptive pills: Secondary | ICD-10-CM

## 2024-06-11 ENCOUNTER — Other Ambulatory Visit: Payer: Self-pay

## 2024-06-11 ENCOUNTER — Encounter: Payer: Self-pay | Admitting: Obstetrics

## 2024-06-11 DIAGNOSIS — Z3041 Encounter for surveillance of contraceptive pills: Secondary | ICD-10-CM

## 2024-06-11 MED ORDER — NORGESTIM-ETH ESTRAD TRIPHASIC 0.18/0.215/0.25 MG-35 MCG PO TABS: ORAL_TABLET | ORAL | 12 refills | Status: AC

## 2024-08-24 ENCOUNTER — Other Ambulatory Visit: Payer: Self-pay | Admitting: Obstetrics

## 2024-08-24 DIAGNOSIS — N760 Acute vaginitis: Secondary | ICD-10-CM

## 2024-09-24 ENCOUNTER — Other Ambulatory Visit: Payer: Self-pay

## 2024-09-24 ENCOUNTER — Telehealth: Admitting: Physician Assistant

## 2024-09-24 ENCOUNTER — Other Ambulatory Visit: Payer: Self-pay | Admitting: Obstetrics

## 2024-09-24 DIAGNOSIS — N760 Acute vaginitis: Secondary | ICD-10-CM

## 2024-09-24 DIAGNOSIS — B3731 Acute candidiasis of vulva and vagina: Secondary | ICD-10-CM

## 2024-09-24 MED ORDER — FLUCONAZOLE 150 MG PO TABS: 150.0000 mg | ORAL_TABLET | Freq: Once | ORAL | 0 refills | Status: AC

## 2024-09-24 MED ORDER — METRONIDAZOLE 0.75 % VA GEL: Freq: Two times a day (BID) | VAGINAL | 2 refills | Status: AC

## 2024-09-24 NOTE — Progress Notes (Signed)
